# Patient Record
Sex: Male | Born: 1960 | Race: Black or African American | Hispanic: No | Smoking: Never smoker
Health system: Southern US, Community
[De-identification: ages and names within clinical notes are randomized; demographics above are authoritative.]

## PROBLEM LIST (undated history)

## (undated) DIAGNOSIS — I639 Cerebral infarction, unspecified: Secondary | ICD-10-CM

## (undated) DIAGNOSIS — I10 Essential (primary) hypertension: Secondary | ICD-10-CM

## (undated) HISTORY — PX: CIRCUMCISION: SUR203

---

## 2008-02-03 ENCOUNTER — Ambulatory Visit: Payer: Self-pay | Admitting: Cardiology

## 2016-11-04 ENCOUNTER — Other Ambulatory Visit (HOSPITAL_COMMUNITY)
Admission: RE | Admit: 2016-11-04 | Discharge: 2016-11-04 | Disposition: A | Discharge status: Home / Self Care | Payer: Managed Care, Other (non HMO) | Source: Other Acute Inpatient Hospital | Attending: Urology | Admitting: Urology

## 2016-11-04 ENCOUNTER — Ambulatory Visit (INDEPENDENT_AMBULATORY_CARE_PROVIDER_SITE_OTHER): Payer: Managed Care, Other (non HMO) | Admitting: Urology

## 2016-11-04 DIAGNOSIS — N401 Enlarged prostate with lower urinary tract symptoms: Secondary | ICD-10-CM | POA: Diagnosis not present

## 2016-11-04 DIAGNOSIS — N5201 Erectile dysfunction due to arterial insufficiency: Secondary | ICD-10-CM

## 2016-11-04 DIAGNOSIS — R972 Elevated prostate specific antigen [PSA]: Secondary | ICD-10-CM | POA: Diagnosis not present

## 2016-11-11 ENCOUNTER — Ambulatory Visit: Payer: Managed Care, Other (non HMO) | Admitting: Urology

## 2016-11-11 ENCOUNTER — Other Ambulatory Visit (HOSPITAL_COMMUNITY)
Admission: RE | Admit: 2016-11-11 | Discharge: 2016-11-11 | Disposition: A | Discharge status: Home / Self Care | Payer: Managed Care, Other (non HMO) | Source: Other Acute Inpatient Hospital | Attending: Urology | Admitting: Urology

## 2016-11-11 DIAGNOSIS — R972 Elevated prostate specific antigen [PSA]: Secondary | ICD-10-CM | POA: Insufficient documentation

## 2016-11-14 LAB — MISC LABCORP TEST (SEND OUT): LABCORP TEST CODE: 489160

## 2017-08-25 ENCOUNTER — Ambulatory Visit (INDEPENDENT_AMBULATORY_CARE_PROVIDER_SITE_OTHER): Payer: Managed Care, Other (non HMO) | Admitting: Urology

## 2017-08-25 DIAGNOSIS — R972 Elevated prostate specific antigen [PSA]: Secondary | ICD-10-CM | POA: Diagnosis not present

## 2018-03-19 ENCOUNTER — Other Ambulatory Visit: Payer: Self-pay | Admitting: Neurosurgery

## 2018-03-24 ENCOUNTER — Other Ambulatory Visit: Payer: Self-pay | Admitting: Neurosurgery

## 2018-03-31 NOTE — Pre-Procedure Instructions (Signed)
Tom Palmer  03/31/2018      [id number] - [place], [place] - [address] AT [address] of [address] [place] [address] Phone: [phone] Fax: [phone]    Your procedure is scheduled on Thursday, Sept. 19th   Report to [facility]             (posted surgery time 2:04p - 4:22p)   Call this number if you have problems the morning of surgery:  [phone]   Remember:   Do not eat any foods or drink any liquids after midnight, Wednesday.              4-5 days prior to surgery, STOP TAKING ANY Vitamins, Herbal Supplements, Anti-inflammatories.    Take these medicines the morning of surgery with A SIP OF WATER : Amlodipine, Cyclobenzaprine.    Do not wear jewelry - no rings or watches.  Do not wear lotions, colognes or deodorant.             Men may shave face and neck.  Do not bring valuables to the hospital.  [facility] is not responsible for any belongings or valuables.  Contacts, dentures or bridgework may not be worn into surgery.  Leave your suitcase in the car.  After surgery it may be brought to your room.  For patients admitted to the hospital, discharge time will be determined by your treatment team.  Please read over the following fact sheets that you were given. MRSA Information and Surgical Site Infection Prevention      [facility]- Preparing For Surgery  Before surgery, you can play an important role. Because skin is not sterile, your skin needs to be as free of germs as possible. You can reduce the number of germs on your skin by washing with CHG (chlorahexidine gluconate) Soap before surgery.  CHG is an antiseptic cleaner which kills germs and bonds with the skin to continue killing germs even after washing.    Oral Hygiene is also important to reduce your risk of infection.    Remember - BRUSH YOUR TEETH THE MORNING OF SURGERY WITH YOUR REGULAR TOOTHPASTE  Please do  not use if you have an allergy to CHG or antibacterial soaps. If your skin becomes reddened/irritated stop using the CHG.  Do not shave (including legs and underarms) for at least 48 hours prior to first CHG shower. It is OK to shave your face.  Please follow these instructions carefully.   1. Shower the NIGHT BEFORE SURGERY and the MORNING OF SURGERY with CHG.   2. If you chose to wash your hair, wash your hair first as usual with your normal shampoo.  3. After you shampoo, rinse your hair and body thoroughly to remove the shampoo.  4. Use CHG as you would any other liquid soap. You can apply CHG directly to the skin and wash gently with a scrungie or a clean washcloth.   5. Apply the CHG Soap to your body ONLY FROM THE NECK DOWN.  Do not use on open wounds or open sores. Avoid contact with your eyes, ears, mouth and genitals (private parts). Wash Face and genitals (private parts)  with your normal soap.  6. Wash thoroughly, paying special attention to the area where your surgery will be performed.  7. Thoroughly rinse your body with warm water from the neck down.  8. DO NOT shower/wash with your normal soap after  using and rinsing off the CHG Soap.  9. Pat yourself dry with a CLEAN TOWEL.  10. Wear CLEAN PAJAMAS to bed the night before surgery, wear comfortable clothes the morning of surgery  11. Place CLEAN SHEETS on your bed the night of your first shower and DO NOT SLEEP WITH PETS.  Day of Surgery:  Do not apply any deodorants/lotions.  Please wear clean clothes to the hospital/surgery center.    Remember to brush your teeth WITH YOUR REGULAR TOOTHPASTE.

## 2018-04-01 ENCOUNTER — Encounter (HOSPITAL_COMMUNITY)
Admission: RE | Admit: 2018-04-01 | Discharge: 2018-04-01 | Disposition: A | Discharge status: Home / Self Care | Payer: Managed Care, Other (non HMO) | Source: Ambulatory Visit | Attending: Neurosurgery | Admitting: Neurosurgery

## 2018-04-01 ENCOUNTER — Other Ambulatory Visit: Payer: Self-pay

## 2018-04-01 ENCOUNTER — Encounter (HOSPITAL_COMMUNITY): Payer: Self-pay

## 2018-04-01 DIAGNOSIS — Z79899 Other long term (current) drug therapy: Secondary | ICD-10-CM | POA: Insufficient documentation

## 2018-04-01 DIAGNOSIS — Z8673 Personal history of transient ischemic attack (TIA), and cerebral infarction without residual deficits: Secondary | ICD-10-CM | POA: Diagnosis not present

## 2018-04-01 DIAGNOSIS — R001 Bradycardia, unspecified: Secondary | ICD-10-CM | POA: Diagnosis not present

## 2018-04-01 DIAGNOSIS — Z01818 Encounter for other preprocedural examination: Secondary | ICD-10-CM | POA: Diagnosis present

## 2018-04-01 DIAGNOSIS — M5126 Other intervertebral disc displacement, lumbar region: Secondary | ICD-10-CM | POA: Diagnosis not present

## 2018-04-01 DIAGNOSIS — I10 Essential (primary) hypertension: Secondary | ICD-10-CM | POA: Insufficient documentation

## 2018-04-01 DIAGNOSIS — Z7982 Long term (current) use of aspirin: Secondary | ICD-10-CM | POA: Diagnosis not present

## 2018-04-01 HISTORY — DX: Cerebral infarction, unspecified: I63.9

## 2018-04-01 HISTORY — DX: Essential (primary) hypertension: I10

## 2018-04-01 LAB — BASIC METABOLIC PANEL
ANION GAP: 8 (ref 5–15)
BUN: 7 mg/dL (ref 6–20)
CALCIUM: 9.3 mg/dL (ref 8.9–10.3)
CO2: 25 mmol/L (ref 22–32)
Chloride: 108 mmol/L (ref 98–111)
Creatinine, Ser: 1.07 mg/dL (ref 0.61–1.24)
GLUCOSE: 103 mg/dL — AB (ref 70–99)
Potassium: 3.5 mmol/L (ref 3.5–5.1)
Sodium: 141 mmol/L (ref 135–145)

## 2018-04-01 LAB — SURGICAL PCR SCREEN
MRSA, PCR: NEGATIVE
Staphylococcus aureus: POSITIVE — AB

## 2018-04-01 LAB — CBC
HCT: 40.7 % (ref 39.0–52.0)
Hemoglobin: 13.5 g/dL (ref 13.0–17.0)
MCH: 29.4 pg (ref 26.0–34.0)
MCHC: 33.2 g/dL (ref 30.0–36.0)
MCV: 88.7 fL (ref 78.0–100.0)
PLATELETS: 268 10*3/uL (ref 150–400)
RBC: 4.59 MIL/uL (ref 4.22–5.81)
RDW: 13 % (ref 11.5–15.5)
WBC: 5.5 10*3/uL (ref 4.0–10.5)

## 2018-04-01 NOTE — Progress Notes (Signed)
   04/01/18 1405  OBSTRUCTIVE SLEEP APNEA  Have you ever been diagnosed with sleep apnea through a sleep study? No  Do you snore loudly (loud enough to be heard through closed doors)?  0  Do you often feel tired, fatigued, or sleepy during the daytime (such as falling asleep during driving or talking to someone)? 0  Has anyone observed you stop breathing during your sleep? 1  Do you have, or are you being treated for high blood pressure? 1  BMI more than 35 kg/m2? 0  Age > 50 (1-yes) 1  Neck circumference greater than:Male 16 inches or larger, Male 17inches or larger? 1  Male Gender (Yes=1) 1  Obstructive Sleep Apnea Score 5  Score 5 or greater  Results sent to PCP

## 2018-04-01 NOTE — Progress Notes (Signed)
PCP is Dr. Polly CobiaHasanji, LOV 12/2017 Denies any murmur, cp, sob.   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  Affected his right side, with little residual effect.  [facility].  (have rquested the echo, ekg, ultrasound from there) Hasn't seen a cardio in yrs. He is stopping his aspirin as of 9/11.

## 2018-04-01 NOTE — Progress Notes (Signed)
Surgical PCR +staph aureus. [phone]. Patient notified to pick up ointment and start as soon as possible.

## 2018-04-02 ENCOUNTER — Encounter (HOSPITAL_COMMUNITY): Payer: Self-pay

## 2018-04-02 NOTE — Progress Notes (Addendum)
Anesthesia Chart Review:   Case:  [id number] Date/Time:  04/08/18 1408   Procedure:  LAMINECTOMY  AND FORAMINOTOMY BILATERAL LUMBAR 2- LUMBAR 3, LUMBAR 3- LUMBAR 4 (Bilateral ) - LAMINECTOMY  AND FORAMINOTOMY BILATERAL LUMBAR 2- LUMBAR 3, LUMBAR 3- LUMBAR 4   Anesthesia type:  General   Pre-op diagnosis:  DISC DISPLACEMENT, LUMBAR   Location:  [facility] ROOM [facility] / [facility] OR   Surgeon:  Tressie StalkerJenkins, Jeffrey, MD      DISCUSSION: - Pt is a 57 year old male with hx HTN  VS: BP (!) 143/73   Pulse 90   Temp 36.8 C   Resp 20   Ht 6\' 3"  (1.905 m)   Wt 109.4 kg   SpO2 99%   BMI 30.14 kg/m   PROVIDERS: - PCP is Toma DeitersHasanaj, Xaje A, MD   LABS: Labs reviewed: Acceptable for surgery. (all labs ordered are listed, but only abnormal results are displayed)  Labs Reviewed  SURGICAL PCR SCREEN - Abnormal; Notable for the following components:      Result Value   Staphylococcus aureus POSITIVE (*)    All other components within normal limits  BASIC METABOLIC PANEL - Abnormal; Notable for the following components:   Glucose, Bld 103 (*)    All other components within normal limits  CBC    EKG 04/01/18: Sinus bradycardia (57 bpm)   CV:  Stress test 12/13/15 [facility] Attempting to get final report.    ADDENDUM 04/05/2018: Records received from Dr. Olena LeatherwoodHasanaj. Stress test results below:  Description of the test: The patient exercised for total of 1 minute and 20 seconds.  Reached 7 METS.  Target heart rate 139.  Maximal heart rate 158.  Blood pressure 203/102.  Resting EKG revealed sinus rhythm.  No signs of ischemia.  Stress EKG revealed occasional PVCs.  No acute ST segment elevation or depression.  Conclusion of the test: Cardiac stress test felt to be negative.  As such, the patient was reassured, advised to comply with present medication and call with any other problems.   Echo 12/13/15 [facility]):  1.  Study quality is good. 2.  LV chamber size normal. Mild concentric LVH. Global  LV wall motion and contractility are within normal limits.   Estimated EF 60-65%.  Normal LV diastolic filling is observed. 3.  LA mildly dilated. 4.  Aortic valve structure normal.  Mitral valve leaflets appear normal. 5.  RV systolic pressure calculated at 30 mmHg.  Past Medical History:  Diagnosis Date  . Stroke [facility](HCC)    tia back in 2008    Past Surgical History:  Procedure Laterality Date  . CIRCUMCISION      MEDICATIONS: . acetaminophen (TYLENOL) 500 MG tablet  . amLODipine (NORVASC) 10 MG tablet  . aspirin EC 81 MG tablet  . cyclobenzaprine (FLEXERIL) 5 MG tablet  . lisinopril-hydrochlorothiazide (PRINZIDE,ZESTORETIC) 20-12.5 MG tablet  . Omega-3 Fatty Acids (FISH OIL) 1000 MG CAPS  . tadalafil (CIALIS) 5 MG tablet   No current facility-administered medications for this encounter.     Rica Mastngela Kabbe, FNP-BC [facility]/Anesthesiology Phone: [phone] 04/02/2018 1:37 PM

## 2018-04-08 ENCOUNTER — Ambulatory Visit (HOSPITAL_COMMUNITY): Payer: Managed Care, Other (non HMO) | Admitting: Anesthesiology

## 2018-04-08 ENCOUNTER — Ambulatory Visit (HOSPITAL_COMMUNITY)
Admission: RE | Admit: 2018-04-08 | Discharge: 2018-04-09 | Disposition: A | Discharge status: Home / Self Care | Payer: Managed Care, Other (non HMO) | Source: Ambulatory Visit | Attending: Neurosurgery | Admitting: Neurosurgery

## 2018-04-08 ENCOUNTER — Ambulatory Visit (HOSPITAL_COMMUNITY): Payer: Managed Care, Other (non HMO)

## 2018-04-08 ENCOUNTER — Ambulatory Visit (HOSPITAL_COMMUNITY): Payer: Managed Care, Other (non HMO) | Admitting: Physician Assistant

## 2018-04-08 ENCOUNTER — Encounter (HOSPITAL_COMMUNITY)
Admission: RE | Disposition: A | Discharge status: Home / Self Care | Payer: Self-pay | Source: Ambulatory Visit | Attending: Neurosurgery

## 2018-04-08 ENCOUNTER — Encounter (HOSPITAL_COMMUNITY): Payer: Self-pay

## 2018-04-08 DIAGNOSIS — Z8673 Personal history of transient ischemic attack (TIA), and cerebral infarction without residual deficits: Secondary | ICD-10-CM | POA: Insufficient documentation

## 2018-04-08 DIAGNOSIS — Z7982 Long term (current) use of aspirin: Secondary | ICD-10-CM | POA: Insufficient documentation

## 2018-04-08 DIAGNOSIS — M48062 Spinal stenosis, lumbar region with neurogenic claudication: Secondary | ICD-10-CM | POA: Diagnosis present

## 2018-04-08 DIAGNOSIS — I10 Essential (primary) hypertension: Secondary | ICD-10-CM | POA: Insufficient documentation

## 2018-04-08 DIAGNOSIS — Z6829 Body mass index (BMI) 29.0-29.9, adult: Secondary | ICD-10-CM | POA: Insufficient documentation

## 2018-04-08 DIAGNOSIS — E669 Obesity, unspecified: Secondary | ICD-10-CM | POA: Insufficient documentation

## 2018-04-08 DIAGNOSIS — M5116 Intervertebral disc disorders with radiculopathy, lumbar region: Secondary | ICD-10-CM | POA: Diagnosis not present

## 2018-04-08 DIAGNOSIS — Z419 Encounter for procedure for purposes other than remedying health state, unspecified: Secondary | ICD-10-CM

## 2018-04-08 DIAGNOSIS — Z79899 Other long term (current) drug therapy: Secondary | ICD-10-CM | POA: Insufficient documentation

## 2018-04-08 HISTORY — PX: LUMBAR LAMINECTOMY/DECOMPRESSION MICRODISCECTOMY: SHX5026

## 2018-04-08 IMAGING — CR DG LUMBAR SPINE 1V
1 series · 1 of 1 positions shown · non-contrast
Comparison: [DATE] MRI

CLINICAL DATA: Localizing image 4 L2-3/L3-4 laminectomy and
foraminotomy for disc displacement.

EXAM:
LUMBAR SPINE - 1 VIEW

[lateral]
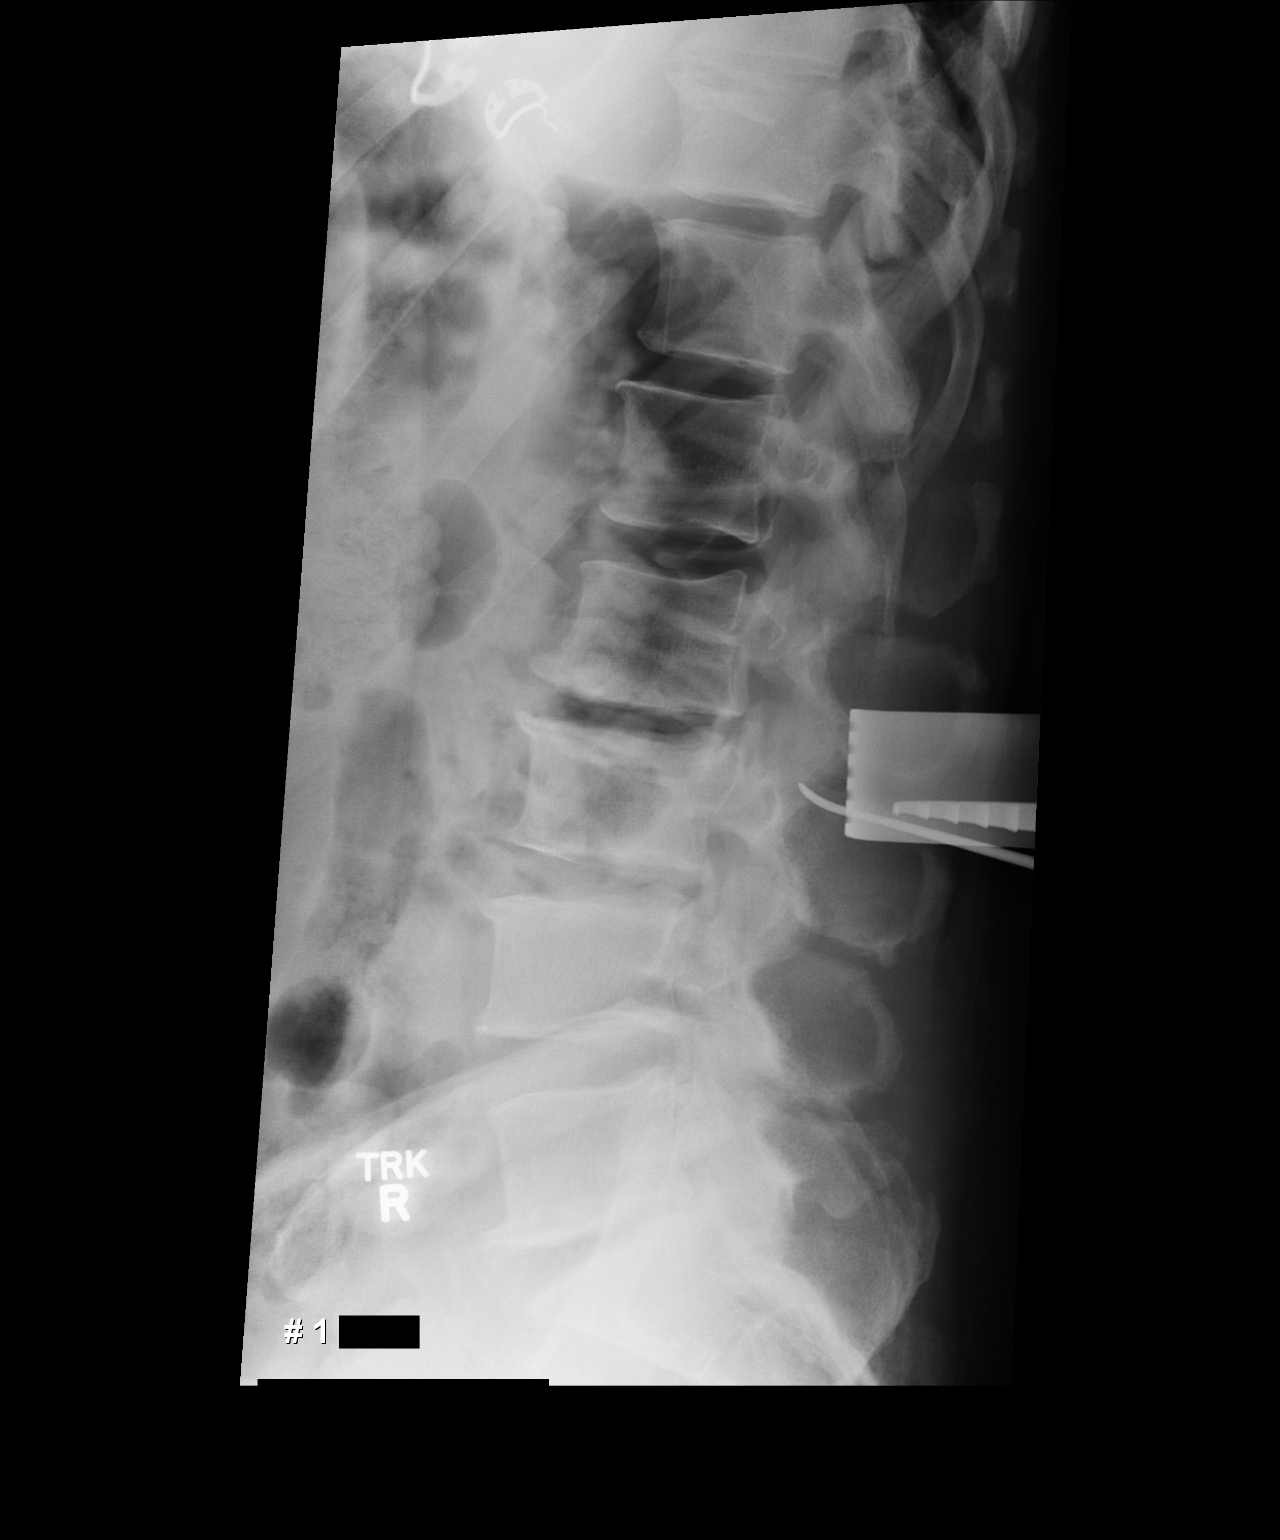

[1 of 1 positions shown; findings below may reference images not displayed]

FINDINGS: A portable cross-table view of the lumbar spine intraoperatively
demonstrates markers projecting over the posterior elements at the
L2-3 disc level. Degenerative disc disease with vacuum phenomenon
noted at L2-3. Facet arthrosis is identified at L4-5 and L5-S1 in
particular.
IMPRESSION: Localizing markers project over the posterior elements at the L2-3
disc level.

## 2018-04-08 SURGERY — LUMBAR LAMINECTOMY/DECOMPRESSION MICRODISCECTOMY 2 LEVELS
Anesthesia: General | Laterality: Bilateral

## 2018-04-08 MED ORDER — AMLODIPINE BESYLATE 5 MG PO TABS
10.0000 mg | ORAL_TABLET | Freq: Every day | ORAL | Status: DC
  Administered 2018-04-08: 10 mg via ORAL
  Filled 2018-04-08 (×2): qty 2

## 2018-04-08 MED ORDER — CEFAZOLIN SODIUM-DEXTROSE 2-4 GM/100ML-% IV SOLN
INTRAVENOUS | Status: AC
  Filled 2018-04-08: qty 100

## 2018-04-08 MED ORDER — SODIUM CHLORIDE 0.9 % IV SOLN
INTRAVENOUS | Status: DC | PRN
  Administered 2018-04-08: 16:00:00

## 2018-04-08 MED ORDER — PROPOFOL 10 MG/ML IV BOLUS
INTRAVENOUS | Status: DC | PRN
  Administered 2018-04-08: 200 mg via INTRAVENOUS
  Administered 2018-04-08: 40 mg via INTRAVENOUS

## 2018-04-08 MED ORDER — OXYCODONE HCL 5 MG/5ML PO SOLN: 5.0000 mg | Freq: Once | ORAL | Status: DC | PRN

## 2018-04-08 MED ORDER — ACETAMINOPHEN 325 MG PO TABS: 650.0000 mg | ORAL_TABLET | ORAL | Status: DC | PRN

## 2018-04-08 MED ORDER — PROPOFOL 10 MG/ML IV BOLUS
INTRAVENOUS | Status: AC
  Filled 2018-04-08: qty 20

## 2018-04-08 MED ORDER — ALBUMIN HUMAN 5 % IV SOLN
INTRAVENOUS | Status: DC | PRN
  Administered 2018-04-08: 18:00:00 via INTRAVENOUS

## 2018-04-08 MED ORDER — DOCUSATE SODIUM 100 MG PO CAPS
100.0000 mg | ORAL_CAPSULE | Freq: Two times a day (BID) | ORAL | Status: DC
  Administered 2018-04-08 – 2018-04-09 (×2): 100 mg via ORAL
  Filled 2018-04-08 (×2): qty 1

## 2018-04-08 MED ORDER — LIDOCAINE 2% (20 MG/ML) 5 ML SYRINGE
INTRAMUSCULAR | Status: DC | PRN
  Administered 2018-04-08: 100 mg via INTRAVENOUS

## 2018-04-08 MED ORDER — ROCURONIUM BROMIDE 10 MG/ML (PF) SYRINGE
PREFILLED_SYRINGE | INTRAVENOUS | Status: DC | PRN
  Administered 2018-04-08: 20 mg via INTRAVENOUS
  Administered 2018-04-08: 10 mg via INTRAVENOUS
  Administered 2018-04-08: 50 mg via INTRAVENOUS

## 2018-04-08 MED ORDER — ONDANSETRON HCL 4 MG/2ML IJ SOLN: 4.0000 mg | Freq: Four times a day (QID) | INTRAMUSCULAR | Status: DC | PRN

## 2018-04-08 MED ORDER — CEFAZOLIN SODIUM-DEXTROSE 2-4 GM/100ML-% IV SOLN
2.0000 g | Freq: Three times a day (TID) | INTRAVENOUS | Status: AC
  Administered 2018-04-08 – 2018-04-09 (×2): 2 g via INTRAVENOUS
  Filled 2018-04-08 (×2): qty 100

## 2018-04-08 MED ORDER — ACETAMINOPHEN 650 MG RE SUPP: 650.0000 mg | RECTAL | Status: DC | PRN

## 2018-04-08 MED ORDER — SODIUM CHLORIDE 0.9 % IV SOLN: 250.0000 mL | INTRAVENOUS | Status: DC

## 2018-04-08 MED ORDER — ONDANSETRON HCL 4 MG/2ML IJ SOLN
INTRAMUSCULAR | Status: DC | PRN
  Administered 2018-04-08: 4 mg via INTRAVENOUS

## 2018-04-08 MED ORDER — BUPIVACAINE-EPINEPHRINE (PF) 0.5% -1:200000 IJ SOLN
INTRAMUSCULAR | Status: DC | PRN
  Administered 2018-04-08: 10 mL
  Administered 2018-04-08: 20 mL

## 2018-04-08 MED ORDER — CHLORHEXIDINE GLUCONATE CLOTH 2 % EX PADS: 6.0000 | MEDICATED_PAD | Freq: Once | CUTANEOUS | Status: DC

## 2018-04-08 MED ORDER — MIDAZOLAM HCL 5 MG/5ML IJ SOLN
INTRAMUSCULAR | Status: DC | PRN
  Administered 2018-04-08: 2 mg via INTRAVENOUS

## 2018-04-08 MED ORDER — DEXAMETHASONE SODIUM PHOSPHATE 10 MG/ML IJ SOLN
INTRAMUSCULAR | Status: DC | PRN
  Administered 2018-04-08: 10 mg via INTRAVENOUS

## 2018-04-08 MED ORDER — GLYCOPYRROLATE PF 0.2 MG/ML IJ SOSY
PREFILLED_SYRINGE | INTRAMUSCULAR | Status: AC
  Filled 2018-04-08: qty 2

## 2018-04-08 MED ORDER — GLYCOPYRROLATE PF 0.2 MG/ML IJ SOSY
PREFILLED_SYRINGE | INTRAMUSCULAR | Status: DC | PRN
  Administered 2018-04-08: .2 mg via INTRAVENOUS

## 2018-04-08 MED ORDER — PROMETHAZINE HCL 25 MG/ML IJ SOLN: 6.2500 mg | INTRAMUSCULAR | Status: DC | PRN

## 2018-04-08 MED ORDER — ARTIFICIAL TEARS OPHTHALMIC OINT
TOPICAL_OINTMENT | OPHTHALMIC | Status: AC
  Filled 2018-04-08: qty 7

## 2018-04-08 MED ORDER — PHENOL 1.4 % MT LIQD: 1.0000 | OROMUCOSAL | Status: DC | PRN

## 2018-04-08 MED ORDER — ONDANSETRON HCL 4 MG/2ML IJ SOLN
INTRAMUSCULAR | Status: AC
  Filled 2018-04-08: qty 2

## 2018-04-08 MED ORDER — HYDROCHLOROTHIAZIDE 12.5 MG PO CAPS
12.5000 mg | ORAL_CAPSULE | Freq: Every day | ORAL | Status: DC
  Administered 2018-04-09: 12.5 mg via ORAL
  Filled 2018-04-08: qty 1

## 2018-04-08 MED ORDER — OXYCODONE HCL 5 MG PO TABS
10.0000 mg | ORAL_TABLET | ORAL | Status: DC | PRN
  Administered 2018-04-08 – 2018-04-09 (×5): 10 mg via ORAL
  Filled 2018-04-08 (×5): qty 2

## 2018-04-08 MED ORDER — 0.9 % SODIUM CHLORIDE (POUR BTL) OPTIME
TOPICAL | Status: DC | PRN
  Administered 2018-04-08: 1000 mL

## 2018-04-08 MED ORDER — OXYCODONE HCL 5 MG PO TABS: 5.0000 mg | ORAL_TABLET | Freq: Once | ORAL | Status: DC | PRN

## 2018-04-08 MED ORDER — CEFAZOLIN SODIUM-DEXTROSE 2-4 GM/100ML-% IV SOLN
2.0000 g | INTRAVENOUS | Status: AC
  Administered 2018-04-08: 2 g via INTRAVENOUS

## 2018-04-08 MED ORDER — BACITRACIN ZINC 500 UNIT/GM EX OINT
TOPICAL_OINTMENT | CUTANEOUS | Status: DC | PRN
  Administered 2018-04-08: 1 via TOPICAL

## 2018-04-08 MED ORDER — BISACODYL 10 MG RE SUPP: 10.0000 mg | Freq: Every day | RECTAL | Status: DC | PRN

## 2018-04-08 MED ORDER — BUPIVACAINE-EPINEPHRINE 0.5% -1:200000 IJ SOLN
INTRAMUSCULAR | Status: AC
  Filled 2018-04-08: qty 1

## 2018-04-08 MED ORDER — BUPIVACAINE LIPOSOME 1.3 % IJ SUSP
20.0000 mL | INTRAMUSCULAR | Status: DC
  Filled 2018-04-08: qty 20

## 2018-04-08 MED ORDER — ACETAMINOPHEN 500 MG PO TABS
1000.0000 mg | ORAL_TABLET | Freq: Four times a day (QID) | ORAL | Status: DC
  Administered 2018-04-08 – 2018-04-09 (×2): 1000 mg via ORAL
  Filled 2018-04-08 (×2): qty 2

## 2018-04-08 MED ORDER — LACTATED RINGERS IV SOLN
INTRAVENOUS | Status: DC
  Administered 2018-04-08 (×3): via INTRAVENOUS

## 2018-04-08 MED ORDER — MIDAZOLAM HCL 2 MG/2ML IJ SOLN
INTRAMUSCULAR | Status: AC
  Filled 2018-04-08: qty 2

## 2018-04-08 MED ORDER — MENTHOL 3 MG MT LOZG: 1.0000 | LOZENGE | OROMUCOSAL | Status: DC | PRN

## 2018-04-08 MED ORDER — ONDANSETRON HCL 4 MG PO TABS: 4.0000 mg | ORAL_TABLET | Freq: Four times a day (QID) | ORAL | Status: DC | PRN

## 2018-04-08 MED ORDER — ROCURONIUM BROMIDE 50 MG/5ML IV SOSY
PREFILLED_SYRINGE | INTRAVENOUS | Status: AC
  Filled 2018-04-08: qty 15

## 2018-04-08 MED ORDER — DEXAMETHASONE SODIUM PHOSPHATE 10 MG/ML IJ SOLN
INTRAMUSCULAR | Status: AC
  Filled 2018-04-08: qty 1

## 2018-04-08 MED ORDER — LISINOPRIL 20 MG PO TABS
20.0000 mg | ORAL_TABLET | Freq: Every day | ORAL | Status: DC
  Administered 2018-04-09: 20 mg via ORAL
  Filled 2018-04-08: qty 1

## 2018-04-08 MED ORDER — FENTANYL CITRATE (PF) 100 MCG/2ML IJ SOLN
INTRAMUSCULAR | Status: AC
  Filled 2018-04-08: qty 2

## 2018-04-08 MED ORDER — FENTANYL CITRATE (PF) 250 MCG/5ML IJ SOLN
INTRAMUSCULAR | Status: DC | PRN
  Administered 2018-04-08 (×2): 50 ug via INTRAVENOUS
  Administered 2018-04-08: 100 ug via INTRAVENOUS
  Administered 2018-04-08: 50 ug via INTRAVENOUS

## 2018-04-08 MED ORDER — BACITRACIN ZINC 500 UNIT/GM EX OINT
TOPICAL_OINTMENT | CUTANEOUS | Status: AC
  Filled 2018-04-08: qty 28.35

## 2018-04-08 MED ORDER — CYCLOBENZAPRINE HCL 10 MG PO TABS
10.0000 mg | ORAL_TABLET | Freq: Three times a day (TID) | ORAL | Status: DC | PRN
  Administered 2018-04-08 – 2018-04-09 (×2): 10 mg via ORAL
  Filled 2018-04-08 (×2): qty 1

## 2018-04-08 MED ORDER — MORPHINE SULFATE (PF) 4 MG/ML IV SOLN
4.0000 mg | INTRAVENOUS | Status: DC | PRN
  Filled 2018-04-08: qty 1

## 2018-04-08 MED ORDER — ROCURONIUM BROMIDE 50 MG/5ML IV SOSY
PREFILLED_SYRINGE | INTRAVENOUS | Status: AC
  Filled 2018-04-08: qty 10

## 2018-04-08 MED ORDER — SODIUM CHLORIDE 0.9% FLUSH: 3.0000 mL | Freq: Two times a day (BID) | INTRAVENOUS | Status: DC

## 2018-04-08 MED ORDER — FENTANYL CITRATE (PF) 250 MCG/5ML IJ SOLN
INTRAMUSCULAR | Status: AC
  Filled 2018-04-08: qty 5

## 2018-04-08 MED ORDER — SODIUM CHLORIDE 0.9% FLUSH: 3.0000 mL | INTRAVENOUS | Status: DC | PRN

## 2018-04-08 MED ORDER — HEMOSTATIC AGENTS (NO CHARGE) OPTIME
TOPICAL | Status: DC | PRN
  Administered 2018-04-08: 1 via TOPICAL

## 2018-04-08 MED ORDER — OXYCODONE HCL 5 MG PO TABS: 5.0000 mg | ORAL_TABLET | ORAL | Status: DC | PRN

## 2018-04-08 MED ORDER — FENTANYL CITRATE (PF) 100 MCG/2ML IJ SOLN
25.0000 ug | INTRAMUSCULAR | Status: DC | PRN
  Administered 2018-04-08: 25 ug via INTRAVENOUS

## 2018-04-08 MED ORDER — LISINOPRIL-HYDROCHLOROTHIAZIDE 20-12.5 MG PO TABS: 1.0000 | ORAL_TABLET | Freq: Every day | ORAL | Status: DC

## 2018-04-08 SURGICAL SUPPLY — 56 items
BAG DECANTER FOR FLEXI CONT (MISCELLANEOUS) ×3 IMPLANT
BENZOIN TINCTURE PRP APPL 2/3 (GAUZE/BANDAGES/DRESSINGS) ×3 IMPLANT
BLADE CLIPPER SURG (BLADE) IMPLANT
BUR MATCHSTICK NEURO 3.0 LAGG (BURR) ×3 IMPLANT
BUR PRECISION FLUTE 6.0 (BURR) ×3 IMPLANT
CANISTER SUCT 3000ML PPV (MISCELLANEOUS) ×3 IMPLANT
CARTRIDGE OIL MAESTRO DRILL (MISCELLANEOUS) ×1 IMPLANT
CLOSURE WOUND 1/2 X4 (GAUZE/BANDAGES/DRESSINGS) ×1
DIFFUSER DRILL AIR PNEUMATIC (MISCELLANEOUS) ×3 IMPLANT
DRAPE LAPAROTOMY 100X72X124 (DRAPES) ×3 IMPLANT
DRAPE MICROSCOPE LEICA (MISCELLANEOUS) IMPLANT
DRAPE POUCH INSTRU U-SHP 10X18 (DRAPES) ×3 IMPLANT
DRAPE SURG 17X23 STRL (DRAPES) ×12 IMPLANT
DRSG OPSITE POSTOP 4X6 (GAUZE/BANDAGES/DRESSINGS) ×3 IMPLANT
ELECT BLADE 4.0 EZ CLEAN MEGAD (MISCELLANEOUS) ×3
ELECT REM PT RETURN 9FT ADLT (ELECTROSURGICAL) ×3
ELECTRODE BLDE 4.0 EZ CLN MEGD (MISCELLANEOUS) ×1 IMPLANT
ELECTRODE REM PT RTRN 9FT ADLT (ELECTROSURGICAL) ×1 IMPLANT
GAUZE 4X4 16PLY RFD (DISPOSABLE) IMPLANT
GAUZE SPONGE 4X4 12PLY STRL (GAUZE/BANDAGES/DRESSINGS) ×3 IMPLANT
GLOVE BIO SURGEON STRL SZ8 (GLOVE) ×3 IMPLANT
GLOVE BIO SURGEON STRL SZ8.5 (GLOVE) ×3 IMPLANT
GLOVE BIOGEL PI IND STRL 7.5 (GLOVE) ×1 IMPLANT
GLOVE BIOGEL PI IND STRL 8 (GLOVE) ×2 IMPLANT
GLOVE BIOGEL PI INDICATOR 7.5 (GLOVE) ×2
GLOVE BIOGEL PI INDICATOR 8 (GLOVE) ×4
GLOVE ECLIPSE 7.0 STRL STRAW (GLOVE) ×3 IMPLANT
GLOVE ECLIPSE 7.5 STRL STRAW (GLOVE) ×9 IMPLANT
GLOVE EXAM NITRILE LRG STRL (GLOVE) IMPLANT
GLOVE EXAM NITRILE XL STR (GLOVE) IMPLANT
GLOVE EXAM NITRILE XS STR PU (GLOVE) IMPLANT
GOWN STRL REUS W/ TWL LRG LVL3 (GOWN DISPOSABLE) ×1 IMPLANT
GOWN STRL REUS W/ TWL XL LVL3 (GOWN DISPOSABLE) ×1 IMPLANT
GOWN STRL REUS W/TWL 2XL LVL3 (GOWN DISPOSABLE) ×6 IMPLANT
GOWN STRL REUS W/TWL LRG LVL3 (GOWN DISPOSABLE) ×2
GOWN STRL REUS W/TWL XL LVL3 (GOWN DISPOSABLE) ×2
KIT BASIN OR (CUSTOM PROCEDURE TRAY) ×3 IMPLANT
KIT TURNOVER KIT B (KITS) ×3 IMPLANT
NEEDLE HYPO 21X1.5 SAFETY (NEEDLE) IMPLANT
NEEDLE HYPO 22GX1.5 SAFETY (NEEDLE) ×3 IMPLANT
NS IRRIG 1000ML POUR BTL (IV SOLUTION) ×3 IMPLANT
OIL CARTRIDGE MAESTRO DRILL (MISCELLANEOUS) ×3
PACK LAMINECTOMY NEURO (CUSTOM PROCEDURE TRAY) ×3 IMPLANT
PAD ARMBOARD 7.5X6 YLW CONV (MISCELLANEOUS) ×9 IMPLANT
PATTIES SURGICAL .5 X1 (DISPOSABLE) IMPLANT
RUBBERBAND STERILE (MISCELLANEOUS) IMPLANT
SPOGE SURGIFLO 8M (HEMOSTASIS) ×2
SPONGE SURGIFLO 8M (HEMOSTASIS) ×1 IMPLANT
SPONGE SURGIFOAM ABS GEL SZ50 (HEMOSTASIS) ×3 IMPLANT
STRIP CLOSURE SKIN 1/2X4 (GAUZE/BANDAGES/DRESSINGS) ×2 IMPLANT
SUT VIC AB 1 CT1 18XBRD ANBCTR (SUTURE) ×2 IMPLANT
SUT VIC AB 1 CT1 8-18 (SUTURE) ×4
SUT VIC AB 2-0 CP2 18 (SUTURE) ×6 IMPLANT
TOWEL GREEN STERILE (TOWEL DISPOSABLE) ×3 IMPLANT
TOWEL GREEN STERILE FF (TOWEL DISPOSABLE) ×3 IMPLANT
WATER STERILE IRR 1000ML POUR (IV SOLUTION) ×3 IMPLANT

## 2018-04-08 NOTE — Anesthesia Procedure Notes (Signed)
Procedure Name: Intubation Date/Time: 04/08/2018 3:14 PM Performed by: Elliot DallyHuggins, Kaleab Frasier, CRNA Pre-anesthesia Checklist: Patient identified, Emergency Drugs available, Suction available and Patient being monitored Patient Re-evaluated:Patient Re-evaluated prior to induction Oxygen Delivery Method: Circle System Utilized Preoxygenation: Pre-oxygenation with 100% oxygen Induction Type: IV induction Ventilation: Mask ventilation without difficulty Laryngoscope Size: Miller and 3 Grade View: Grade I Tube type: Oral Tube size: 7.5 mm Number of attempts: 1 Airway Equipment and Method: Stylet and Oral airway Placement Confirmation: ETT inserted through vocal cords under direct vision,  positive ETCO2 and breath sounds checked- equal and bilateral Secured at: 24 cm Tube secured with: Tape Dental Injury: Teeth and Oropharynx as per pre-operative assessment

## 2018-04-08 NOTE — Transfer of Care (Signed)
Immediate Anesthesia Transfer of Care Note  Patient: Tom Palmer  Procedure(s) Performed: LAMINECTOMY  AND FORAMINOTOMY BILATERAL LUMBAR TWO-THREE, LUMBAR THREE-FOUR (Bilateral )  Patient Location: PACU  Anesthesia Type:General  Level of Consciousness: drowsy  Airway & Oxygen Therapy: Patient Spontanous Breathing and Patient connected to nasal cannula oxygen  Post-op Assessment: Report given to RN and Post -op Vital signs reviewed and stable  Post vital signs: Reviewed and stable  Last Vitals:  Vitals Value Taken Time  BP 121/82 04/08/2018  5:55 PM  Temp    Pulse 94 04/08/2018  5:59 PM  Resp 17 04/08/2018  5:59 PM  SpO2 99 % 04/08/2018  5:59 PM  Vitals shown include unvalidated device data.  Last Pain:  Vitals:   04/08/18 1204  TempSrc:   PainSc: 3       Patients Stated Pain Goal: 3 (04/08/18 1204)  Complications: No apparent anesthesia complications

## 2018-04-08 NOTE — Op Note (Signed)
Brief history: The patient is a 57 year old black male who has complained of back and leg pain and weakness consistent with a lumbar radiculopathy/neurogenic claudication.  He has failed medical management and was worked up with a lumbar MRI.  This demonstrated L2-3 and L3-4 stenosis with a left L3-4 herniated disc.  I discussed the various treatment options with the patient including surgery.  He has weighed the risks, benefits, and alternatives of surgery and decided proceed with bilateral L2-3 and 3 4 laminotomy/foraminotomies and a left L3-4 discectomy.  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 Delma OfficerJeff Deztiny Sarra  Asst.: Dr. Conchita ParisNundkumar  Anesthesia: Gen. endotracheal  Estimated blood loss: 100 cc  Drains: None  Complications: None  Description of procedure: The patient was brought to the operating room by the anesthesia team. General endotracheal anesthesia was induced. The patient was turned to the prone position on the Wilson frame. The patient's lumbosacral region was then prepared with Betadine scrub and Betadine solution. Sterile drapes were applied.  I then injected the area to be incised with Marcaine with epinephrine solution. I then used a scalpel to make a linear midline incision over the L2-3 and L3-4 intervertebral disc space. I then used electrocautery to perform a bilateral subperiosteal dissection exposing the spinous process and lamina of L2, L3 and L4. We obtained intraoperative radiograph to confirm our location. I then inserted the [facility] retractor for exposure.  We then brought the operative microscope into the field. Under its  magnification and illumination we completed the microdissection. I used a high-speed drill to perform anomalies bilaterally at L2-3 and L3-4. I then used a Kerrison punches to widen the laminotomies and removed the ligamentum flavum at L2-3 and L3-4 bilaterally. We then used microdissection to free up the thecal sac and the bilateral L3 and L4 nerve root from the epidural tissue. I then used a Kerrison punch to perform a foraminotomy at about the level L3 and L4 nerve root. We then using the nerve root retractor to gently retract the thecal sac and the L3 nerve root medially L2-3.  We inspected the intervertebral disc.  It was bulging a bit but not herniated.  We did not perform a discectomy at this level.  We then retracted the L4 nerve root medially at L3-4.  This exposed the intervertebral disc. We identified the ruptured disc and remove it with the pituitary forceps.  There were no large holes in the annulus nor impending herniations so we did not perform a intervertebral discectomy.  I then palpated along the ventral surface of the thecal sac and along exit route of the bilateral L3 and L4 nerve root and noted that the neural structures were well decompressed. This completed the decompression.  We then obtained hemostasis using bipolar electrocautery. We irrigated the wound out with bacitracin solution. We then removed the retractor. We then reapproximated the patient's thoracolumbar fascia with interrupted #1 Vicryl suture. We then reapproximated the patient's subcutaneous tissue with interrupted 2-0 Vicryl suture. We then reapproximated patient's skin with Steri-Strips and benzoin. The was then coated with bacitracin ointment. The drapes were removed. The patient was subsequently returned to the supine position where they were extubated by the anesthesia team. The patient was then transported to the postanesthesia care unit in stable condition.  All sponge instrument and needle counts were reportedly  correct at the end of this case.

## 2018-04-08 NOTE — H&P (Signed)
Subjective: The patient is a 57 year old black male who has complained of back pain with a foot drop.  He has failed medical management.  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  I discussed the various treatment with the patient.  He has decided to proceed with surgery.  Past Medical History:  Diagnosis Date  . Hypertension   . Stroke [facility](HCC)    tia back in 2008    Past Surgical History:  Procedure Laterality Date  . CIRCUMCISION      No Known Allergies  Social History   Tobacco Use  . Smoking status: Never Smoker  . Smokeless tobacco: Never Used  Substance Use Topics  . Alcohol use: Yes    Alcohol/week: 4.0 standard drinks    Types: 3 Cans of beer, 1 Shots of liquor per week    History reviewed. No pertinent family history. Prior to Admission medications   Medication Sig Start Date End Date Taking? Authorizing Provider  acetaminophen (TYLENOL) 500 MG tablet Take 1,000 mg by mouth every 6 (six) hours as needed for moderate pain.   Yes [provider]  amLODipine (NORVASC) 10 MG tablet Take 10 mg by mouth daily. 02/26/18  Yes [provider]  aspirin EC 81 MG tablet Take 81 mg by mouth daily.   Yes [provider]  cyclobenzaprine (FLEXERIL) 5 MG tablet Take 5 mg by mouth 3 (three) times daily as needed for muscle spasms.  03/01/18  Yes [provider]  lisinopril-hydrochlorothiazide (PRINZIDE,ZESTORETIC) 20-12.5 MG tablet Take 1 tablet by mouth daily. 12/30/17  Yes [provider]  Omega-3 Fatty Acids (FISH OIL) 1000 MG CAPS Take 1,000 mg by mouth 2 (two) times daily.   Yes [provider]  tadalafil (CIALIS) 5 MG tablet Take 5 mg by mouth daily as needed (enlarged prostate).   Yes [provider]     Review of Systems  Positive ROS: As above  All other systems have been reviewed and were otherwise negative with the exception of those  mentioned in the HPI and as above.  Objective: Vital signs in last 24 hours: Temp:  [97.9 F (36.6 C)] 97.9 F (36.6 C) (09/19 1153) Pulse Rate:  [66] 66 (09/19 1153) Resp:  [18] 18 (09/19 1153) BP: (152)/(78) 152/78 (09/19 1153) SpO2:  [100 %] 100 % (09/19 1153) Weight:  [107 kg] 107 kg (09/19 1153) Estimated body mass index is 29.5 kg/m as calculated from the following:   Height as of this encounter: 6\' 3"  (1.905 m).   Weight as of this encounter: 107 kg.   General Appearance: Alert Head: Normocephalic, without obvious abnormality, atraumatic Eyes: PERRL, conjunctiva/corneas clear, EOM's intact,    Ears: Normal  Throat: Normal  Neck: Supple, Back: unremarkable Lungs: Clear to auscultation bilaterally, respirations unlabored Heart: Regular rate and rhythm, no murmur, rub or gallop Abdomen: Soft, non-tender Extremities: Extremities normal, atraumatic, no cyanosis or edema Skin: unremarkable  NEUROLOGIC:   Mental status: alert and oriented,Motor Exam -he has bilateral EHL/dorsiflexor weakness. Sensory Exam - grossly normal Reflexes:  Coordination - grossly normal Gait - grossly normal Balance - grossly normal Cranial Nerves: I: smell Not tested  II: visual acuity  OS: Normal  OD: Normal   II: visual fields Full to confrontation  II: pupils Equal, round, reactive to light  III,VII: ptosis None  III,IV,VI: extraocular muscles  Full ROM  V: mastication Normal  V: facial light  touch sensation  Normal  V,VII: corneal reflex  Present  VII: facial muscle function - upper  Normal  VII: facial muscle function - lower Normal  VIII: hearing Not tested  IX: soft palate elevation  Normal  IX,X: gag reflex Present  XI: trapezius strength  5/5  XI: sternocleidomastoid strength 5/5  XI: neck flexion strength  5/5  XII: tongue strength  Normal    Data Review Lab Results  Component Value Date   WBC 5.5 04/01/2018   HGB 13.5 04/01/2018   HCT 40.7 04/01/2018   MCV 88.7  04/01/2018   PLT 268 04/01/2018   Lab Results  Component Value Date   NA 141 04/01/2018   K 3.5 04/01/2018   CL 108 04/01/2018   CO2 25 04/01/2018   BUN 7 04/01/2018   CREATININE 1.07 04/01/2018   GLUCOSE 103 (H) 04/01/2018   No results found for: INR, PROTIME  Assessment/Plan: 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  We have discussed the various treatment options including surgery.  I have described the surgical treatment option of an L2-3 and L3-4 bilateral laminotomy, foraminotomy and left L3-4 discectomy.  I have shown him surgical models.  I have given him a surgical pamphlet.  We have discussed the risks, benefits, alternatives, expected postoperative course, and likelihood of achieving our goals with surgery.  I have answered all the patient's questions.  He has decided to proceed with surgery.   Cristi Loron 04/08/2018 2:45 PM

## 2018-04-08 NOTE — Anesthesia Preprocedure Evaluation (Addendum)
Anesthesia Evaluation  Patient identified by MRN, date of birth, ID band Patient awake    Reviewed: Allergy & Precautions, NPO status , Patient's Chart, lab work & pertinent test results  History of Anesthesia Complications Negative for: history of anesthetic complications  Airway Mallampati: II  TM Distance: >3 FB Neck ROM: Full    Dental  (+) Dental Advisory Given, Teeth Intact   Pulmonary neg pulmonary ROS,    breath sounds clear to auscultation       Cardiovascular hypertension, Pt. on medications (-) angina Rhythm:Regular Rate:Normal     Neuro/Psych  Right leg numbness  TIAnegative psych ROS   GI/Hepatic negative GI ROS, Neg liver ROS,   Endo/Other   Obesity   Renal/GU negative Renal ROS  negative genitourinary   Musculoskeletal negative musculoskeletal ROS (+)   Abdominal   Peds  Hematology negative hematology ROS (+)   Anesthesia Other Findings   Reproductive/Obstetrics                            Anesthesia Physical Anesthesia Plan  ASA: II  Anesthesia Plan: General   Post-op Pain Management:    Induction: Intravenous  PONV Risk Score and Plan: 3 and Treatment may vary due to age or medical condition, Ondansetron, Midazolam and Dexamethasone  Airway Management Planned: Oral ETT  Additional Equipment: None  Intra-op Plan:   Post-operative Plan: Extubation in OR  Informed Consent: I have reviewed the patients History and Physical, chart, labs and discussed the procedure including the risks, benefits and alternatives for the proposed anesthesia with the patient or authorized representative who has indicated his/her understanding and acceptance.   Dental advisory given  Plan Discussed with: CRNA and Anesthesiologist  Anesthesia Plan Comments:        Anesthesia Quick Evaluation

## 2018-04-09 ENCOUNTER — Encounter (HOSPITAL_COMMUNITY): Payer: Self-pay | Admitting: Neurosurgery

## 2018-04-09 ENCOUNTER — Other Ambulatory Visit: Payer: Self-pay

## 2018-04-09 DIAGNOSIS — M48062 Spinal stenosis, lumbar region with neurogenic claudication: Secondary | ICD-10-CM | POA: Diagnosis not present

## 2018-04-09 LAB — BASIC METABOLIC PANEL
Anion gap: 11 (ref 5–15)
BUN: 10 mg/dL (ref 6–20)
CALCIUM: 9.2 mg/dL (ref 8.9–10.3)
CO2: 24 mmol/L (ref 22–32)
Chloride: 104 mmol/L (ref 98–111)
Creatinine, Ser: 1 mg/dL (ref 0.61–1.24)
GFR calc Af Amer: >60 mL/min (ref >60)
GFR calc non Af Amer: >60 mL/min (ref >60)
GLUCOSE: 143 mg/dL — AB (ref 70–99)
Potassium: 4 mmol/L (ref 3.5–5.1)
Sodium: 139 mmol/L (ref 135–145)

## 2018-04-09 LAB — CBC
HCT: 34.6 % — ABNORMAL LOW (ref 39.0–52.0)
Hemoglobin: 12.1 g/dL — ABNORMAL LOW (ref 13.0–17.0)
MCH: 29.7 pg (ref 26.0–34.0)
MCHC: 35 g/dL (ref 30.0–36.0)
MCV: 84.8 fL (ref 78.0–100.0)
Platelets: 250 10*3/uL (ref 150–400)
RBC: 4.08 MIL/uL — ABNORMAL LOW (ref 4.22–5.81)
RDW: 12.7 % (ref 11.5–15.5)
WBC: 10.7 10*3/uL — ABNORMAL HIGH (ref 4.0–10.5)

## 2018-04-09 MED ORDER — OXYCODONE HCL 5 MG PO TABS: 5.0000 mg | ORAL_TABLET | ORAL | 0 refills | Status: DC | PRN

## 2018-04-09 MED ORDER — DOCUSATE SODIUM 100 MG PO CAPS: 100.0000 mg | ORAL_CAPSULE | Freq: Two times a day (BID) | ORAL | 0 refills | Status: DC

## 2018-04-09 NOTE — Evaluation (Signed)
Physical Therapy Evaluation Patient Details Name: Tom Palmer MRN: [id number] DOB: 1960-08-20 Today's Date: 04/09/2018   History of Present Illness  Pt is s/p bilateral L2-3 and L3-4 laminotomy/foraminotomies and a left L3-4 discectomy  Clinical Impression  Patient evaluated by Physical Therapy with no further acute PT needs identified. Pt ambulating 350 feet with no assistive device; gait abnormalities and mild balance deficits noted. Recommended use of cane at home for balance. Negotiated 3 steps with left railing to prepare for discharge home. All education has been completed and the patient has no further questions. See below for any follow-up Physical Therapy or equipment needs. PT is signing off. Thank you for this referral.     Follow Up Recommendations Outpatient PT;Supervision for mobility/OOB    Equipment Recommendations  None recommended by PT    Recommendations for Other Services       Precautions / Restrictions Precautions Precautions: Fall;Back Precaution Booklet Issued: Yes (comment) Precaution Comments: reviewed and provided written handout Restrictions Weight Bearing Restrictions: No      Mobility  Bed Mobility Overal bed mobility: Modified Independent             General bed mobility comments: cues for log roll technique  Transfers Overall transfer level: Needs assistance Equipment used: None Transfers: Sit to/from Stand Sit to Stand: Min guard         General transfer comment: min guard for safety; increased time to achieve upright  Ambulation/Gait Ambulation/Gait assistance: Min guard Gait Distance (Feet): 350 Feet Assistive device: None Gait Pattern/deviations: Step-through pattern;Decreased stride length Gait velocity: decr   General Gait Details: Patient with increased left foot supination and decreased left heel strike at initial contact, able to improve with cues. Trendelenberg gait noted.   Stairs Stairs: Yes Stairs  assistance: Min guard Stair Management: One rail Left Number of Stairs: 3 General stair comments: step by step pattern; cues for sequencing  Wheelchair Mobility    Modified Rankin (Stroke Patients Only)       Balance Overall balance assessment: Mild deficits observed, not formally tested                                           Pertinent Vitals/Pain Pain Assessment: Faces Faces Pain Scale: Hurts little more Pain Location: incisional Pain Descriptors / Indicators: Operative site guarding;Grimacing Pain Intervention(s): Monitored during session    Home Living Family/patient expects to be discharged to:: Private residence Living Arrangements: Spouse/significant other Available Help at Discharge: Family Type of Home: House Home Access: Stairs to enter Entrance Stairs-Rails: Left Entrance Stairs-Number of Steps: 3 Home Layout: Laundry or work area in basement;Able to live on main level with bedroom/bathroom Home Equipment: Gilmer MorCane - single point;Shower seat      Prior Function Level of Independence: Independent with assistive device(s)         Comments: uses cane for mobility occasionally; works as Artistteam manager     Hand Dominance        Extremity/Trunk Assessment   Upper Extremity Assessment Upper Extremity Assessment: Overall WFL for tasks assessed    Lower Extremity Assessment Lower Extremity Assessment: Overall WFL for tasks assessed(some proximal weakness noted during gait)    Cervical / Trunk Assessment Cervical / Trunk Assessment: Other exceptions Cervical / Trunk Exceptions: s/p spinal sx  Communication   Communication: No difficulties  Cognition Arousal/Alertness: Awake/alert Behavior During Therapy: WFL for tasks assessed/performed  Overall Cognitive Status: Within Functional Limits for tasks assessed                                        General Comments General comments (skin integrity, edema, etc.): donned  pants independently with cues for set up    Exercises     Assessment/Plan    PT Assessment Patent does not need any further PT services  PT Problem List         PT Treatment Interventions      PT Goals (Current goals can be found in the Care Plan section)  Acute Rehab PT Goals Patient Stated Goal: "put my whole foot on the ground." PT Goal Formulation: All assessment and education complete, DC therapy    Frequency     Barriers to discharge        Co-evaluation               AM-PAC PT "6 Clicks" Daily Activity  Outcome Measure Difficulty turning over in bed (including adjusting bedclothes, sheets and blankets)?: None Difficulty moving from lying on back to sitting on the side of the bed? : None Difficulty sitting down on and standing up from a chair with arms (e.g., wheelchair, bedside commode, etc,.)?: A Little Help needed moving to and from a bed to chair (including a wheelchair)?: A Little Help needed walking in hospital room?: A Little Help needed climbing 3-5 steps with a railing? : A Little 6 Click Score: 20    End of Session Equipment Utilized During Treatment: Gait belt Activity Tolerance: Patient tolerated treatment well Patient left: in bed;with call bell/phone within reach;with family/visitor present Nurse Communication: Mobility status PT Visit Diagnosis: Unsteadiness on feet (R26.81);Other abnormalities of gait and mobility (R26.89);Pain Pain - part of body: (back)    Time: 0812-0827 PT Time Calculation (min) (ACUTE ONLY): 15 min   Charges:   PT Evaluation $PT Eval Low Complexity: 1 Low         Laurina Bustle, PT, DPT Acute Rehabilitation Services Pager [phone] Office [phone]   Tom Palmer 04/09/2018, 10:50 AM

## 2018-04-09 NOTE — Anesthesia Postprocedure Evaluation (Signed)
Anesthesia Post Note  Patient: [name]  Procedure(s) Performed: LAMINECTOMY  AND FORAMINOTOMY BILATERAL LUMBAR TWO-THREE, LUMBAR THREE-FOUR (Bilateral )     Patient location during evaluation: PACU Anesthesia Type: General Level of consciousness: awake and alert Pain management: pain level controlled Vital Signs Assessment: post-procedure vital signs reviewed and stable Respiratory status: spontaneous breathing, nonlabored ventilation and respiratory function stable Cardiovascular status: blood pressure returned to baseline and stable Postop Assessment: no apparent nausea or vomiting Anesthetic complications: no    Last Vitals:  Vitals:   04/09/18 0350 04/09/18 0721  BP: 133/81 135/76  Pulse: 89 82  Resp: 20 (!) 8  Temp: 36.4 C 36.6 C  SpO2: 95% 97%    Last Pain:  Vitals:   04/09/18 1026  TempSrc:   PainSc: 3                  Beryle Lathehomas E Brock

## 2018-04-09 NOTE — Discharge Summary (Signed)
Physician Discharge Summary  Patient ID: Tom Palmer MRN: [id number] DOB/AGE: August 23, 1960 57 y.o.  Admit date: 04/08/2018 Discharge date: 04/09/2018  Admission Diagnoses: L2-3 and L3-4 spinal stenosis, lumbar radiculopathy, neurogenic claudication, left L3-4 herniated disc, lumbago  Discharge Diagnoses: The same Active Problems:   Lumbar stenosis with neurogenic claudication   Discharged Condition: good  Hospital Course: I performed bilateral L2-3 and L3-4 laminotomy/foraminotomies and a left L3-4 discectomy on the patient on 04/08/2018.  The surgery went well.  The patient's postoperative course was unremarkable.  On postoperative day #1 he requested discharge home.  He was given oral and written discharge instructions.  All his questions were answered.  Consults: Physical therapy, Occupational Therapy Significant Diagnostic Studies: None Treatments: Bilateral L2-3 and L3-4 laminotomy/foraminotomies, left L3-4 discectomy using microdissection Discharge Exam: Blood pressure 135/76, pulse 82, temperature 97.9 F (36.6 C), temperature source Oral, resp. rate (!) 8, height 6\' 3"  (1.905 m), weight 107 kg, SpO2 97 %. Patient is alert and pleasant.  He looks well.  His lower extremity strength is normal.  Disposition: Home  Discharge Instructions    Call MD for:  difficulty breathing, headache or visual disturbances   Complete by:  As directed    Call MD for:  extreme fatigue   Complete by:  As directed    Call MD for:  hives   Complete by:  As directed    Call MD for:  persistant dizziness or light-headedness   Complete by:  As directed    Call MD for:  persistant nausea and vomiting   Complete by:  As directed    Call MD for:  redness, tenderness, or signs of infection (pain, swelling, redness, odor or green/yellow discharge around incision site)   Complete by:  As directed    Call MD for:  severe uncontrolled pain   Complete by:  As directed    Call MD for:   temperature >100.4   Complete by:  As directed    Diet - low sodium heart healthy   Complete by:  As directed    Discharge instructions   Complete by:  As directed    Call [phone] for a followup appointment. Take a stool softener while you are using pain medications.   Driving Restrictions   Complete by:  As directed    Do not drive for 2 weeks.   Increase activity slowly   Complete by:  As directed    Lifting restrictions   Complete by:  As directed    Do not lift more than 5 pounds. No excessive bending or twisting.   May shower / Bathe   Complete by:  As directed    Remove the dressing for 3 days after surgery.  You may shower, but leave the incision alone.   Remove dressing in 48 hours   Complete by:  As directed    Your stitches are under the scan and will dissolve by themselves. The Steri-Strips will fall off after you take a few showers. Do not rub back or pick at the wound, Leave the wound alone.     Allergies as of 04/09/2018   No Known Allergies     Medication List    STOP taking these medications   acetaminophen 500 MG tablet Commonly known as:  TYLENOL     TAKE these medications   amLODipine 10 MG tablet Commonly known as:  NORVASC Take 10 mg by mouth daily.   aspirin EC 81 MG tablet Take 81 mg  by mouth daily.   cyclobenzaprine 5 MG tablet Commonly known as:  FLEXERIL Take 5 mg by mouth 3 (three) times daily as needed for muscle spasms.   docusate sodium 100 MG capsule Commonly known as:  COLACE Take 1 capsule (100 mg total) by mouth 2 (two) times daily.   Fish Oil 1000 MG Caps Take 1,000 mg by mouth 2 (two) times daily.   lisinopril-hydrochlorothiazide 20-12.5 MG tablet Commonly known as:  PRINZIDE,ZESTORETIC Take 1 tablet by mouth daily.   oxyCODONE 5 MG immediate release tablet Commonly known as:  Oxy IR/ROXICODONE Take 1 tablet (5 mg total) by mouth every 4 (four) hours as needed for moderate pain ((score 4 to 6)).   tadalafil 5 MG  tablet Commonly known as:  CIALIS Take 5 mg by mouth daily as needed (enlarged prostate).        Signed: Cristi LoronJeffrey D Nikitia Asbill 04/09/2018, 7:29 AM

## 2018-04-09 NOTE — Progress Notes (Signed)
Patient is discharged from room 3C09 at this time. Alert and in stable condition. IV site d/c'd and instructions read to patient and spouse with understanding verbalized. Left unit via wheelchair with all belongings at side. 

## 2018-05-25 ENCOUNTER — Ambulatory Visit (INDEPENDENT_AMBULATORY_CARE_PROVIDER_SITE_OTHER): Payer: Managed Care, Other (non HMO) | Admitting: Urology

## 2018-05-25 DIAGNOSIS — R972 Elevated prostate specific antigen [PSA]: Secondary | ICD-10-CM | POA: Diagnosis not present

## 2018-05-25 DIAGNOSIS — N5201 Erectile dysfunction due to arterial insufficiency: Secondary | ICD-10-CM

## 2018-05-25 DIAGNOSIS — N401 Enlarged prostate with lower urinary tract symptoms: Secondary | ICD-10-CM

## 2018-05-25 DIAGNOSIS — R3912 Poor urinary stream: Secondary | ICD-10-CM

## 2019-04-26 ENCOUNTER — Ambulatory Visit (INDEPENDENT_AMBULATORY_CARE_PROVIDER_SITE_OTHER): Payer: Managed Care, Other (non HMO) | Admitting: Urology

## 2019-04-26 DIAGNOSIS — N401 Enlarged prostate with lower urinary tract symptoms: Secondary | ICD-10-CM

## 2019-04-26 DIAGNOSIS — R3915 Urgency of urination: Secondary | ICD-10-CM

## 2019-04-26 DIAGNOSIS — R972 Elevated prostate specific antigen [PSA]: Secondary | ICD-10-CM

## 2019-04-26 DIAGNOSIS — N5201 Erectile dysfunction due to arterial insufficiency: Secondary | ICD-10-CM

## 2019-04-28 ENCOUNTER — Other Ambulatory Visit (HOSPITAL_COMMUNITY): Payer: Self-pay | Admitting: Urology

## 2019-04-28 ENCOUNTER — Other Ambulatory Visit: Payer: Self-pay | Admitting: Urology

## 2019-04-28 DIAGNOSIS — R972 Elevated prostate specific antigen [PSA]: Secondary | ICD-10-CM

## 2019-06-20 ENCOUNTER — Ambulatory Visit (HOSPITAL_COMMUNITY)
Admission: RE | Admit: 2019-06-20 | Discharge: 2019-06-20 | Disposition: A | Discharge status: Home / Self Care | Payer: Managed Care, Other (non HMO) | Source: Ambulatory Visit | Attending: Urology | Admitting: Urology

## 2019-06-20 ENCOUNTER — Other Ambulatory Visit: Payer: Self-pay

## 2019-06-20 DIAGNOSIS — R972 Elevated prostate specific antigen [PSA]: Secondary | ICD-10-CM | POA: Diagnosis present

## 2019-06-20 LAB — POCT I-STAT CREATININE: Creatinine, Ser: 1.1 mg/dL (ref 0.61–1.24)

## 2019-06-20 MED ORDER — GADOBUTROL 1 MMOL/ML IV SOLN
10.0000 mL | Freq: Once | INTRAVENOUS | Status: AC | PRN
  Administered 2019-06-20: 10 mL via INTRAVENOUS

## 2019-06-20 MED ORDER — LIDOCAINE HCL URETHRAL/MUCOSAL 2 % EX GEL
CUTANEOUS | Status: AC
  Filled 2019-06-20: qty 30

## 2019-08-18 ENCOUNTER — Other Ambulatory Visit: Payer: Self-pay | Admitting: Urology

## 2019-08-18 DIAGNOSIS — R972 Elevated prostate specific antigen [PSA]: Secondary | ICD-10-CM

## 2019-08-18 NOTE — Progress Notes (Unsigned)
Biopsies essentially negative--will set up OV in 6 mos w/ PSA

## 2019-08-19 ENCOUNTER — Other Ambulatory Visit: Payer: Self-pay | Admitting: Urology

## 2019-08-19 ENCOUNTER — Telehealth: Payer: Self-pay

## 2019-08-19 NOTE — Telephone Encounter (Signed)
-----   Message from Marcine Matar, MD sent at 08/18/2019  1:34 PM EST -----   I called and left a message with negative biopsy results.  Please set up office visit in 6 months.  I put PSA order in.

## 2019-08-19 NOTE — Telephone Encounter (Signed)
Left message to return call 

## 2019-08-23 NOTE — Telephone Encounter (Signed)
Called x 2 no answer

## 2019-09-01 ENCOUNTER — Telehealth: Payer: Self-pay

## 2019-09-01 NOTE — Telephone Encounter (Signed)
-----   Message from Ferdinand Lango, RN sent at 08/18/2019  2:18 PM EST -----  ----- Message ----- From: Marcine Matar, MD Sent: 08/18/2019   1:34 PM EST To: Ferdinand Lango, RN    I called and left a message with negative biopsy results.  Please set up office visit in 6 months.  I put PSA order in.

## 2019-09-01 NOTE — Telephone Encounter (Signed)
Unable to be reached by phone. Appt. and lab order mailed.

## 2019-12-27 ENCOUNTER — Other Ambulatory Visit: Payer: Self-pay

## 2020-01-03 ENCOUNTER — Ambulatory Visit: Payer: Managed Care, Other (non HMO) | Admitting: Urology

## 2020-01-17 ENCOUNTER — Encounter: Payer: Self-pay | Admitting: Urology

## 2020-01-17 ENCOUNTER — Ambulatory Visit (INDEPENDENT_AMBULATORY_CARE_PROVIDER_SITE_OTHER): Payer: Managed Care, Other (non HMO) | Admitting: Urology

## 2020-01-17 ENCOUNTER — Other Ambulatory Visit: Payer: Self-pay

## 2020-01-17 VITALS — BP 147/70 | HR 51 | Temp 97.5°F | Ht 75.0 in | Wt 230.0 lb

## 2020-01-17 DIAGNOSIS — R350 Frequency of micturition: Secondary | ICD-10-CM | POA: Diagnosis not present

## 2020-01-17 DIAGNOSIS — R972 Elevated prostate specific antigen [PSA]: Secondary | ICD-10-CM

## 2020-01-17 DIAGNOSIS — N401 Enlarged prostate with lower urinary tract symptoms: Secondary | ICD-10-CM | POA: Diagnosis not present

## 2020-01-17 DIAGNOSIS — N521 Erectile dysfunction due to diseases classified elsewhere: Secondary | ICD-10-CM | POA: Diagnosis not present

## 2020-01-17 LAB — POCT URINALYSIS DIPSTICK
Bilirubin, UA: NEGATIVE
Blood, UA: NEGATIVE
Glucose, UA: NEGATIVE
Ketones, UA: NEGATIVE
Leukocytes, UA: NEGATIVE
Nitrite, UA: NEGATIVE
Protein, UA: POSITIVE — AB
Spec Grav, UA: <=1.005 — AB (ref 1.010–1.025)
Urobilinogen, UA: 0.2 E.U./dL
pH, UA: 8.5 — AB (ref 5.0–8.0)

## 2020-01-17 MED ORDER — FINASTERIDE 5 MG PO TABS: 5.0000 mg | ORAL_TABLET | Freq: Every day | ORAL | 11 refills | Status: DC

## 2020-01-17 MED ORDER — TADALAFIL 20 MG PO TABS: 20.0000 mg | ORAL_TABLET | ORAL | 11 refills | Status: DC | PRN

## 2020-01-17 NOTE — Progress Notes (Signed)
See progress note.

## 2020-01-17 NOTE — Progress Notes (Signed)
H&P  Chief Complaint: Elevated PSA  History of Present Illness: This man presents for follow-up of elevated PSA/BPH.  History is as follows:  TRUS/Bx performed 9.26.2014 by Dr Julien Girt volume 48 ml. PSA 6, PSAD 0.14.   10.6.2020: PSA 10.31.2019--8.6. PCA-3 (8.19.2020) 26.   11.30.2020: Prostate MRI--volume 125 ml. PIRADS 4 lesion left posterolateral/left anterior peripheral zone 20x6x14 ml.  No evidence of NVI/SV involvement, capsular invasion, lymphadenopathy, bony abnormalities.   1.22.2021:  Fusion Bx. all 4 cores from region of interest revealed benign findings.  2 cores, right base lateral, right mid lateral, revealed atypia.  1 core, left mid lateral, revealed high-grade PIN.  6.29.2021: No significant change in urinary pattern.    Her  Past Medical History:  Diagnosis Date  . Hypertension   . Stroke [facility])    tia back in 2008    Past Surgical History:  Procedure Laterality Date  . CIRCUMCISION    . LUMBAR LAMINECTOMY/DECOMPRESSION MICRODISCECTOMY Bilateral 04/08/2018   Procedure: LAMINECTOMY  AND FORAMINOTOMY BILATERAL LUMBAR TWO-THREE, LUMBAR THREE-FOUR;  Surgeon: Tressie Stalker, MD;  Location: [facility] OR;  Service: Neurosurgery;  Laterality: Bilateral;  LAMINECTOMY  AND FORAMINOTOMY BILATERAL LUMBAR TWO-THREE, LUMBAR THREE-FOUR    Home Medications:  Allergies as of 01/17/2020   No Known Allergies     Medication List       Accurate as of January 17, 2020  2:40 PM. If you have any questions, ask your nurse or doctor.        STOP taking these medications   [employer] Antacid/Anti-Gas 200-200-20 MG/5ML suspension Generic drug: alum & mag hydroxide-simeth Stopped by: Chelsea Aus, MD   [employer] Stool Softener/Laxative 8.6-50 MG tablet Generic drug: senna-docusate Stopped by: Chelsea Aus, MD   cyclobenzaprine 5 MG tablet Commonly known as: FLEXERIL Stopped by: Chelsea Aus, MD   docusate sodium 100 MG capsule Commonly known as: COLACE Stopped  by: Chelsea Aus, MD   famotidine 20 MG tablet Commonly known as: PEPCID Stopped by: Chelsea Aus, MD   levocetirizine 5 MG tablet Commonly known as: XYZAL Stopped by: Chelsea Aus, MD   lisinopril-hydrochlorothiazide 20-12.5 MG tablet Commonly known as: ZESTORETIC Stopped by: Chelsea Aus, MD   oxyCODONE 5 MG immediate release tablet Commonly known as: Oxy IR/ROXICODONE Stopped by: Chelsea Aus, MD     TAKE these medications   amLODipine 10 MG tablet Commonly known as: NORVASC Take 10 mg by mouth daily. What changed: Another medication with the same name was removed. Continue taking this medication, and follow the directions you see here. Changed by: Chelsea Aus, MD   aspirin EC 81 MG tablet Take 81 mg by mouth daily.   finasteride 5 MG tablet Commonly known as: PROSCAR Take 1 tablet (5 mg total) by mouth daily. Started by: Chelsea Aus, MD   Fish Oil 1000 MG Caps Take 1,000 mg by mouth 2 (two) times daily.   tadalafil 5 MG tablet Commonly known as: CIALIS Take 5 mg by mouth daily as needed (enlarged prostate). What changed: Another medication with the same name was added. Make sure you understand how and when to take each. Changed by: Chelsea Aus, MD   tadalafil 20 MG tablet Commonly known as: CIALIS Take 1 tablet (20 mg total) by mouth as needed for erectile dysfunction. What changed: You were already taking a medication with the same name, and this prescription was added. Make sure you understand how and when to take each. Changed by: Jeannett Senior  Elza Rafter, MD   tamsulosin 0.4 MG Caps capsule Commonly known as: FLOMAX Take 0.4 mg by mouth daily.       Allergies: No Known Allergies  History reviewed. No pertinent family history.  Social History:  reports that he has never smoked. He has never used smokeless tobacco. He reports current alcohol use of about 4.0 standard drinks of alcohol per week. He  reports that he does not use drugs.  ROS: Urological Symptom Review  Patient is experiencing the following symptoms: Leakage of urine Trouble starting stream Weak stream Erection problems (male only) Review of Systems Gastrointestinal (upper)  : Negative for upper GI symptoms Gastrointestinal (lower) : Negative for lower GI symptoms Constitutional : Negative for symptoms Skin: Negative for skin symptoms Eyes: Negative for eye symptoms Ear/Nose/Throat : Sinus problems Hematologic/Lymphatic: Negative for Hematologic/Lymphatic symptoms Cardiovascular : Negative for cardiovascular symptoms Respiratory : Negative for respiratory symptoms Endocrine: Negative for endocrine symptoms Musculoskeletal: Back pain Neurological: Negative for neurological symptoms Psychologic: Negative for psychiatric symptoms  Physical Exam:  Vital signs in last 24 hours: BP (!) 147/70   Pulse (!) 51   Temp (!) 97.5 F (36.4 C)   Ht 6\' 3"  (1.905 m)   Wt 230 lb (104.3 kg)   BMI 28.75 kg/m  Constitutional:  Alert and oriented, No acute distress Cardiovascular: Regular rate  Respiratory: Normal respiratory effort Lymphatic: No lymphadenopathy Neurologic: Grossly intact, no focal deficits Psychiatric: Normal mood and affect   Results for orders placed or performed in visit on 01/17/20 (from the past 24 hour(s))  POCT urinalysis dipstick     Status: Abnormal   Collection Time: 01/17/20 11:15 AM  Result Value Ref Range   Color, UA yellow    Clarity, UA     Glucose, UA Negative Negative   Bilirubin, UA neg    Ketones, UA neg    Spec Grav, UA <=1.005 (A) 1.010 - 1.025   Blood, UA neg    pH, UA 8.5 (A) 5.0 - 8.0   Protein, UA Positive (A) Negative   Urobilinogen, UA 0.2 0.2 or 1.0 E.U./dL   Nitrite, UA neg    Leukocytes, UA Negative Negative   Appearance clear    Odor     I have reviewed prior pt notes  I have reviewed notes from referring/previous physicians  I have reviewed  urinalysis results  I have independently reviewed prior imaging  I have reviewed prior PSA results   Impression/Assessment:   he has had an elevated PSA which is stable.  Plan:    1.  I reassured him about his exam   2.  I will see him back in about 6 months for PSA recheck.

## 2020-01-18 ENCOUNTER — Encounter: Payer: Self-pay | Admitting: Urology

## 2020-06-21 ENCOUNTER — Other Ambulatory Visit: Payer: Self-pay

## 2020-06-21 DIAGNOSIS — R972 Elevated prostate specific antigen [PSA]: Secondary | ICD-10-CM

## 2020-07-09 ENCOUNTER — Other Ambulatory Visit: Payer: Managed Care, Other (non HMO)

## 2020-07-09 ENCOUNTER — Other Ambulatory Visit: Payer: Self-pay

## 2020-07-09 DIAGNOSIS — R972 Elevated prostate specific antigen [PSA]: Secondary | ICD-10-CM

## 2020-07-10 LAB — PSA: Prostate Specific Ag, Serum: 5.7 ng/mL — ABNORMAL HIGH (ref 0.0–4.0)

## 2020-07-17 ENCOUNTER — Other Ambulatory Visit: Payer: Self-pay

## 2020-07-17 ENCOUNTER — Encounter: Payer: Self-pay | Admitting: Urology

## 2020-07-17 ENCOUNTER — Ambulatory Visit (INDEPENDENT_AMBULATORY_CARE_PROVIDER_SITE_OTHER): Payer: Managed Care, Other (non HMO) | Admitting: Urology

## 2020-07-17 VITALS — BP 157/82 | HR 53 | Temp 98.4°F | Ht 75.0 in | Wt 238.0 lb

## 2020-07-17 DIAGNOSIS — N401 Enlarged prostate with lower urinary tract symptoms: Secondary | ICD-10-CM

## 2020-07-17 DIAGNOSIS — R350 Frequency of micturition: Secondary | ICD-10-CM

## 2020-07-17 LAB — URINALYSIS, ROUTINE W REFLEX MICROSCOPIC
Bilirubin, UA: NEGATIVE
Glucose, UA: NEGATIVE
Leukocytes,UA: NEGATIVE
Nitrite, UA: NEGATIVE
Specific Gravity, UA: >1.03 — ABNORMAL HIGH (ref 1.005–1.030)
Urobilinogen, Ur: 2 mg/dL — ABNORMAL HIGH (ref 0.2–1.0)
pH, UA: 6.5 (ref 5.0–7.5)

## 2020-07-17 LAB — MICROSCOPIC EXAMINATION
Bacteria, UA: NONE SEEN
Epithelial Cells (non renal): NONE SEEN /hpf (ref 0–10)
Renal Epithel, UA: NONE SEEN /hpf
WBC, UA: NONE SEEN /hpf (ref 0–5)

## 2020-07-17 NOTE — Progress Notes (Signed)
H&P  Chief Complaint: Elevated PSA  History of Present Illness:  12.28.2021: Pt reports no significant change in LUTS. He continues on finasteride but has lapsed on tamsulosin. He notes that he has had an adequate stream during this tamsulosin vacation.  PSA: 5.7 (11.4 corrected for finasteride)  IPSS Questionnaire (AUA-7): Over the past month.   1)  How often have you had a sensation of not emptying your bladder completely after you finish urinating?  3 - About half the time  2)  How often have you had to urinate again less than two hours after you finished urinating? 3 - About half the time  3)  How often have you found you stopped and started again several times when you urinated?  3 - About half the time  4) How difficult have you found it to postpone urination?  0 - Not at all  5) How often have you had a weak urinary stream?  4 - More than half the time  6) How often have you had to push or strain to begin urination?  2 - Less than half the time  7) How many times did you most typically get up to urinate from the time you went to bed until the time you got up in the morning?  1 - 1 time  Total score:  0-7 mildly symptomatic   8-19 moderately symptomatic   20-35 severely symptomatic  IPSS: 16 QoL Score: 3   (below copied from [facility] records):   TRUS/Bx performed 9.26.2014 by Dr Julien Girt volume 48 ml. PSA 6, PSAD 0.14.   10.6.2020: PSA 10.31.2019--8.6. PCA-3 (8.19.2020) 26.    11.30.2020: MRI prostate.  Volume 125 mL.  PI-RADS 4 lesion at left posterior/anterior peripheral zone 20 x 6 x 14 mm.  There was no evidence of neurovascular/seminal vesicle/capsular involvement, no lymphadenopathy or bony abnormalities.  1.22.2021: Fusion biopsy.  Prostate volume 158 mL.  4 cores from region of interest were negative.  2 cores from systematic biopsy revealed small focus of atypia, 1 revealed high-grade PIN.   CC: I am having trouble with my erections.   HPI: He is now on to  tadalafil 5 mg daily with adequate symptomatic response.   10.6.2020: He states that 5 mg of tadalafil is now only working occasionally -- he asks about increasing his dosage.   CC: BPH   HPI: 10.6.2020: He states that he has been tolerating his urinary sx's slightly less well than before. He is most bothered by his urinary frequency/urgency during the day -- this is especially disruptive for his work schedule.    Past Medical History:  Diagnosis Date  . Hypertension   . Stroke [facility])    tia back in 2008    Past Surgical History:  Procedure Laterality Date  . CIRCUMCISION    . LUMBAR LAMINECTOMY/DECOMPRESSION MICRODISCECTOMY Bilateral 04/08/2018   Procedure: LAMINECTOMY  AND FORAMINOTOMY BILATERAL LUMBAR TWO-THREE, LUMBAR THREE-FOUR;  Surgeon: Tressie Stalker, MD;  Location: [facility];  Service: Neurosurgery;  Laterality: Bilateral;  LAMINECTOMY  AND FORAMINOTOMY BILATERAL LUMBAR TWO-THREE, LUMBAR THREE-FOUR    Home Medications:  Allergies as of 07/17/2020   No Known Allergies     Medication List       Accurate as of July 17, 2020  9:29 AM. If you have any questions, ask your nurse or doctor.        amLODipine 10 MG tablet Commonly known as: NORVASC Take 10 mg by mouth daily.   aspirin EC 81 MG  tablet Take 81 mg by mouth daily.   finasteride 5 MG tablet Commonly known as: PROSCAR Take 1 tablet (5 mg total) by mouth daily.   Fish Oil 1000 MG Caps Take 1,000 mg by mouth 2 (two) times daily.   tadalafil 5 MG tablet Commonly known as: CIALIS Take 5 mg by mouth daily as needed (enlarged prostate).   tadalafil 20 MG tablet Commonly known as: CIALIS Take 1 tablet (20 mg total) by mouth as needed for erectile dysfunction.   tamsulosin 0.4 MG Caps capsule Commonly known as: FLOMAX Take 0.4 mg by mouth daily.       Allergies: No Known Allergies  No family history on file.  Social History:  reports that he has never smoked. He has never used smokeless tobacco.  He reports current alcohol use of about 4.0 standard drinks of alcohol per week. He reports that he does not use drugs.  ROS: A complete review of systems was performed.  All systems are negative except for pertinent findings as noted.  Physical Exam:  Vital signs in last 24 hours: There were no vitals taken for this visit. Constitutional:  Alert and oriented, No acute distress Respiratory: Normal respiratory effort GI: Abdomen is soft, nontender, nondistended, no abdominal masses. No CVAT.  Genitourinary: Normal male phallus, testes are descended bilaterally and non-tender and without masses, scrotum is normal in appearance without lesions or masses, perineum is normal on inspection. Prostate feels larger than 100 grams. Neurologic: Grossly intact, no focal deficits Psychiatric: Normal mood and affect  I have reviewed prior pt notes  I have reviewed notes from referring/previous physicians  I have reviewed urinalysis results  I have independently reviewed prior imaging--MRI I have reviewed prior PSA/pathology results    Impression/Assessment:  Elevated PSA: Pt PSA is elevated but relatively stable. He requires no additional surveillance at this time but if his PSA rises significantly during the next 6 months, he will be followed up w/ MRI fusion bx  BPH: Pt LUTS are stable w/out tamsulosin but he continues on finasteride  Plan:  1. Pt discontinued on tamsulosin, continued on finasteride.  2. F/U in 6 months for OV, DRE, PSA, and symptom recheck.  CC: Dr. Lia Hopping

## 2020-07-17 NOTE — Progress Notes (Signed)
Urological Symptom Review  Patient is experiencing the following symptoms: Frequent urination Leakage of urine Stream starts and stops Erection problems (male only)   Review of Systems  Gastrointestinal (upper)  : Negative for upper GI symptoms  Gastrointestinal (lower) : Negative for lower GI symptoms  Constitutional : Negative for symptoms  Skin: Negative for skin symptoms  Eyes: Negative for eye symptoms  Ear/Nose/Throat : Sinus problems   Hematologic/Lymphatic: Negative for Hematologic/Lymphatic symptoms  Cardiovascular : Negative for cardiovascular symptoms  Respiratory : Negative for respiratory symptoms  Endocrine: Negative for endocrine symptoms  Musculoskeletal: Back pain  Neurological: Negative for neurological symptoms  Psychologic: Negative for psychiatric symptoms

## 2021-01-11 ENCOUNTER — Other Ambulatory Visit: Payer: Managed Care, Other (non HMO)

## 2021-01-11 ENCOUNTER — Other Ambulatory Visit: Payer: Self-pay

## 2021-01-11 DIAGNOSIS — N401 Enlarged prostate with lower urinary tract symptoms: Secondary | ICD-10-CM

## 2021-01-12 LAB — PSA: Prostate Specific Ag, Serum: 4.3 ng/mL — ABNORMAL HIGH (ref 0.0–4.0)

## 2021-01-14 NOTE — Progress Notes (Addendum)
History of Present Illness:  Telehealth visit today for follow-up of the below listed issues:  Elevated PSA:   TRUS/Bx performed 9.26.2014 by Dr Julien Girt volume 48 ml. PSA 6, PSAD 0.14.   10.6.2020: PSA 10.31.2019--8.6. PCA-3 (8.19.2020) 26.      11.30.2020: MRI prostate.  Volume 125 mL.  PI-RADS 4 lesion at left posterior/anterior peripheral zone 20 x 6 x 14 mm.  There was no evidence of neurovascular/seminal vesicle/capsular involvement, no lymphadenopathy or bony abnormalities.   1.22.2021: Fusion biopsy.  Prostate volume 158 mL.  4 cores from ROI were negative.  2 cores from systematic biopsy revealed small focus of atypia, 1 revealed high-grade PIN.  12.28.2021: PSA 5.7 (finasteride correction 11.4)  6.28.2022: PSA 4.3 (corrected 8.6)    ED    6.28.2022: He now takes as needed tadalafil, 20 mg at a time.  With this, he gets adequate erections.  BPH   6.28.2022: He is on dutasteride now, prescribed by Dr. Olena Leatherwood.  He feels like he has a good stream and is emptying fine.  Since his last visit here, he has had no significant urinary complaints, infections or blood in urine.  Past Medical History:  Diagnosis Date   Hypertension    Stroke (HCC)    tia back in 2008    Past Surgical History:  Procedure Laterality Date   CIRCUMCISION     LUMBAR LAMINECTOMY/DECOMPRESSION MICRODISCECTOMY Bilateral 04/08/2018   Procedure: LAMINECTOMY  AND FORAMINOTOMY BILATERAL LUMBAR TWO-THREE, LUMBAR THREE-FOUR;  Surgeon: Tressie Stalker, MD;  Location: [facility] OR;  Service: Neurosurgery;  Laterality: Bilateral;  LAMINECTOMY  AND FORAMINOTOMY BILATERAL LUMBAR TWO-THREE, LUMBAR THREE-FOUR    Home Medications:  Allergies as of 01/15/2021   No Known Allergies      Medication List        Accurate as of January 14, 2021  8:09 PM. If you have any questions, ask your nurse or doctor.          amLODipine 10 MG tablet Commonly known as: NORVASC Take 10 mg by mouth daily.   aspirin EC  81 MG tablet Take 81 mg by mouth daily.   finasteride 5 MG tablet Commonly known as: PROSCAR Take 1 tablet (5 mg total) by mouth daily.   Fish Oil 1000 MG Caps Take 1,000 mg by mouth 2 (two) times daily.   tadalafil 5 MG tablet Commonly known as: CIALIS Take 5 mg by mouth daily as needed (enlarged prostate).   tadalafil 20 MG tablet Commonly known as: CIALIS Take 1 tablet (20 mg total) by mouth as needed for erectile dysfunction.   tamsulosin 0.4 MG Caps capsule Commonly known as: FLOMAX Take 0.4 mg by mouth daily.        Allergies: No Known Allergies  No family history on file.  Social History:  reports that he has never smoked. He has never used smokeless tobacco. He reports current alcohol use of about 4.0 standard drinks of alcohol per week. He reports that he does not use drugs.  ROS: A complete review of systems was performed.  All systems are negative except for pertinent findings as noted.   I have reviewed prior pt notes  I have reviewed notes from referring/previous physician  I have independently reviewed prior pathology results  I have reviewed prior PSA results  Impression/Assessment:  1.  BPH.  He has a very large gland.  He seems to do well with his current dose of dutasteride.  2.  Elevated PSA.  Negative biopsy in the  past.  Prostate size about 140 mL.  Stable/slightly lower PSA  3.  ED, doing well on tadalafil  Plan:  1.  We will cancel his appointment for August  2.  I will see back in 6 months with PSA  3.  Continue on dutasteride  4.  I refilled tadalafil  I connected with  Dub Amis on 01/15/21 by a video enabled telemedicine application and verified that I am speaking with the correct person using two identifiers.   I discussed the limitations of evaluation and management by telemedicine. The patient expressed understanding and agreed to proceed.  Pt seen in context of Covid 19 pandemic  Pt location: Home My  location: Home office Persons on call: Patient and myself  17 minutes were spent on the phone with the patient

## 2021-01-15 ENCOUNTER — Ambulatory Visit (INDEPENDENT_AMBULATORY_CARE_PROVIDER_SITE_OTHER): Payer: Managed Care, Other (non HMO) | Admitting: Urology

## 2021-01-15 DIAGNOSIS — R972 Elevated prostate specific antigen [PSA]: Secondary | ICD-10-CM | POA: Diagnosis not present

## 2021-01-15 DIAGNOSIS — N521 Erectile dysfunction due to diseases classified elsewhere: Secondary | ICD-10-CM

## 2021-01-15 MED ORDER — TADALAFIL 20 MG PO TABS: 20.0000 mg | ORAL_TABLET | ORAL | 11 refills | Status: DC | PRN

## 2021-01-30 ENCOUNTER — Telehealth: Payer: Self-pay | Admitting: Urology

## 2021-01-30 ENCOUNTER — Other Ambulatory Visit: Payer: Self-pay

## 2021-01-30 DIAGNOSIS — N521 Erectile dysfunction due to diseases classified elsewhere: Secondary | ICD-10-CM

## 2021-01-30 DIAGNOSIS — N401 Enlarged prostate with lower urinary tract symptoms: Secondary | ICD-10-CM

## 2021-01-30 MED ORDER — TADALAFIL 20 MG PO TABS: 20.0000 mg | ORAL_TABLET | ORAL | 11 refills | Status: DC | PRN

## 2021-01-30 NOTE — Telephone Encounter (Signed)
Pt called wanting to check on a prescription that [facility] was supposed to send in for him. Please call when you can.

## 2021-01-30 NOTE — Telephone Encounter (Signed)
Spoke with patient. Tadalafil rx sent to [facility] as requested.

## 2021-02-12 ENCOUNTER — Encounter: Payer: Self-pay | Admitting: Urology

## 2021-02-19 ENCOUNTER — Ambulatory Visit: Payer: Managed Care, Other (non HMO) | Admitting: Urology

## 2021-07-08 ENCOUNTER — Other Ambulatory Visit: Payer: Self-pay

## 2021-07-08 ENCOUNTER — Other Ambulatory Visit: Payer: Managed Care, Other (non HMO)

## 2021-07-08 DIAGNOSIS — N521 Erectile dysfunction due to diseases classified elsewhere: Secondary | ICD-10-CM

## 2021-07-09 LAB — PSA: Prostate Specific Ag, Serum: 3.9 ng/mL (ref 0.0–4.0)

## 2021-07-15 NOTE — Progress Notes (Addendum)
History of Present Illness: Pt presents for followup of elevated PSA, BPH as well as ED.  Elevated PSA:  TRUS/Bx performed 9.26.2014 by Dr Julien Girt volume 48 ml. PSA 6, PSAD 0.14.  10.6.2020: PSA 10.31.2019--8.6. PCA-3 (8.19.2020) 26.    11.30.2020: MRI prostate.  Volume 125 mL.  PI-RADS 4 lesion at left posterior/anterior peripheral zone 20 x 6 x 14 mm.  There was no evidence of neurovascular/seminal vesicle/capsular involvement, no lymphadenopathy or bony abnormalities.   1.22.2021: Fusion biopsy.  Prostate volume 158 mL.  4 cores from ROI were negative.  2 cores from systematic biopsy revealed small focus of atypia, 1 revealed high-grade PIN.   12.28.2021: PSA 5.7 (finasteride correction 11.4) 6.28.2022: PSA 4.3 (corrected 8.6)  12.27.2022: PSA 3.9 (corrected 7.8).  On dutasteride   ED     12.27.2022: On Cialis 20 mg.  Does not always work appropriately.  BPH   12.27.2022: Still on dutasteride.  He is having a bit of a worse time with urinary urgency and rare urgency incontinence. IPSS 17, quality-of-life score 2.  He is not on tamsulosin.    Past Medical History:  Diagnosis Date   Hypertension    Stroke (HCC)    tia back in 2008    Past Surgical History:  Procedure Laterality Date   CIRCUMCISION     LUMBAR LAMINECTOMY/DECOMPRESSION MICRODISCECTOMY Bilateral 04/08/2018   Procedure: LAMINECTOMY  AND FORAMINOTOMY BILATERAL LUMBAR TWO-THREE, LUMBAR THREE-FOUR;  Surgeon: Tressie Stalker, MD;  Location: [facility] OR;  Service: Neurosurgery;  Laterality: Bilateral;  LAMINECTOMY  AND FORAMINOTOMY BILATERAL LUMBAR TWO-THREE, LUMBAR THREE-FOUR    Home Medications:  Allergies as of 07/16/2021   No Known Allergies      Medication List        Accurate as of July 15, 2021  5:00 PM. If you have any questions, ask your nurse or doctor.          amLODipine 10 MG tablet Commonly known as: NORVASC Take 10 mg by mouth daily.   aspirin EC 81 MG tablet Take 81 mg by  mouth daily.   finasteride 5 MG tablet Commonly known as: PROSCAR Take 1 tablet (5 mg total) by mouth daily.   Fish Oil 1000 MG Caps Take 1,000 mg by mouth 2 (two) times daily.   tadalafil 20 MG tablet Commonly known as: CIALIS Take 1 tablet (20 mg total) by mouth as needed for erectile dysfunction.   tamsulosin 0.4 MG Caps capsule Commonly known as: FLOMAX Take 0.4 mg by mouth daily.        Allergies: No Known Allergies  No family history on file.  Social History:  reports that he has never smoked. He has never used smokeless tobacco. He reports current alcohol use of about 4.0 standard drinks per week. He reports that he does not use drugs.  ROS: A complete review of systems was performed.  All systems are negative except for pertinent findings as noted.  Physical Exam:  Vital signs in last 24 hours: There were no vitals taken for this visit. Constitutional:  Alert and oriented, No acute distress Cardiovascular: Regular rate  Respiratory: Normal respiratory effort GI: Abdomen is soft, nontender, nondistended, no abdominal masses. No CVAT.  Genitourinary: Normal male phallus, testes are descended bilaterally and non-tender and without masses, scrotum is normal in appearance without lesions or masses, perineum is normal on inspection.  Normal anal sphincter tone.  Prostate large, symmetrical.  No nodularity. Lymphatic: No lymphadenopathy Neurologic: Grossly intact, no focal deficits Psychiatric: Normal mood  and affect  I have reviewed prior pt notes  I have reviewed urinalysis results  I have reviewed prior PSA results  I have reviewed IPSS results  Post void residual by bladder scan-72 mL   Impression/Assessment:  1.  BPH with large gland, on dutasteride but not on tamsulosin anymore.  Symptoms a bit worse, IPSS 17.  2.  ED, getting a bit worse.  20 mg of Cialis does not always cut it  3.  Elevated PSA, lower, expected decrease on dutasteride  Plan:  1.   Decrease amount of caffeine in diet  2.  I will change his Cialis to daily 5 mg  3.  Continue dutasteride.  If symptoms still significantly bothersome at next visit which will be in 6 months, consider adding back tamsulosin  4.  I will see back in 6 months with laboratories  CC: Dr Loma Sousa

## 2021-07-16 ENCOUNTER — Ambulatory Visit (INDEPENDENT_AMBULATORY_CARE_PROVIDER_SITE_OTHER): Payer: Managed Care, Other (non HMO) | Admitting: Urology

## 2021-07-16 ENCOUNTER — Other Ambulatory Visit: Payer: Self-pay

## 2021-07-16 ENCOUNTER — Encounter: Payer: Self-pay | Admitting: Urology

## 2021-07-16 VITALS — BP 156/79 | HR 77

## 2021-07-16 DIAGNOSIS — R972 Elevated prostate specific antigen [PSA]: Secondary | ICD-10-CM

## 2021-07-16 DIAGNOSIS — N529 Male erectile dysfunction, unspecified: Secondary | ICD-10-CM

## 2021-07-16 DIAGNOSIS — N401 Enlarged prostate with lower urinary tract symptoms: Secondary | ICD-10-CM

## 2021-07-16 DIAGNOSIS — R350 Frequency of micturition: Secondary | ICD-10-CM | POA: Diagnosis not present

## 2021-07-16 DIAGNOSIS — N521 Erectile dysfunction due to diseases classified elsewhere: Secondary | ICD-10-CM

## 2021-07-16 LAB — URINALYSIS, ROUTINE W REFLEX MICROSCOPIC
Bilirubin, UA: NEGATIVE
Glucose, UA: NEGATIVE
Ketones, UA: NEGATIVE
Leukocytes,UA: NEGATIVE
Nitrite, UA: NEGATIVE
RBC, UA: NEGATIVE
Specific Gravity, UA: 1.015 (ref 1.005–1.030)
Urobilinogen, Ur: 0.2 mg/dL (ref 0.2–1.0)
pH, UA: 7 (ref 5.0–7.5)

## 2021-07-16 MED ORDER — TADALAFIL 5 MG PO TABS: ORAL_TABLET | ORAL | 3 refills | Status: DC

## 2021-07-16 NOTE — Progress Notes (Signed)
Urological Symptom Review  Patient is experiencing the following symptoms: Frequent urination Hard to postpone urination Leakage of urine Stream starts and stops Erection problems (male only)   Review of Systems  Gastrointestinal (upper)  : Negative for upper GI symptoms  Gastrointestinal (lower) : Negative for lower GI symptoms  Constitutional : Negative for symptoms  Skin: Negative for skin symptoms  Eyes: Negative for eye symptoms  Ear/Nose/Throat : Sinus problems  Hematologic/Lymphatic: Negative for Hematologic/Lymphatic symptoms  Cardiovascular : Negative for cardiovascular symptoms  Respiratory : Negative for respiratory symptoms  Endocrine: Negative for endocrine symptoms  Musculoskeletal: Back pain Joint pain  Neurological: Negative for neurological symptoms  Psychologic: Negative for psychiatric symptoms

## 2021-10-31 ENCOUNTER — Encounter: Payer: Self-pay | Admitting: Orthopaedic Surgery

## 2021-10-31 ENCOUNTER — Ambulatory Visit (INDEPENDENT_AMBULATORY_CARE_PROVIDER_SITE_OTHER): Payer: Managed Care, Other (non HMO) | Admitting: Orthopaedic Surgery

## 2021-10-31 VITALS — Ht 75.0 in | Wt 227.0 lb

## 2021-10-31 DIAGNOSIS — M25511 Pain in right shoulder: Secondary | ICD-10-CM

## 2021-10-31 DIAGNOSIS — M25521 Pain in right elbow: Secondary | ICD-10-CM

## 2021-10-31 DIAGNOSIS — G8929 Other chronic pain: Secondary | ICD-10-CM | POA: Diagnosis not present

## 2021-10-31 NOTE — Progress Notes (Signed)
? ?Office Visit Note ?  ?Patient: Tom Palmer           ?Date of Birth: 1960-11-11           ?MRN: [id number] ?Visit Date: 10/31/2021 ?             ?Requested by: Tom Deiters, MD ?[address] ?[place],  [place] [address] ?PCP: Tom Deiters, MD ? ? ?Assessment & Plan: ?Visit Diagnoses: No diagnosis found. ? ?Plan: Work slip given no pulling lifting right arm pending return office visit after MRI scan.  We will proceed with MRI right elbow to rule out distal biceps tendinopathy or partial tearing.  He also needs an MRI scan of right shoulder due to his impingement and history of persistent shoulder pain with rotator cuff weakness.  His shoulder problem is more likely chronic. ? ?Follow-Up Instructions: No follow-ups on file.  ? ?Orders:  ?No orders of the defined types were placed in this encounter. ? ?No orders of the defined types were placed in this encounter. ? ? ? ? Procedures: ?No procedures performed ? ? ?Clinical Data: ?No additional findings. ? ? ?Subjective: ?Chief Complaint  ?Patient presents with  ? Right Arm - Pain  ? ? ?HPI 61 year old male new patient visit referred by Tom Palmer for right shoulder and right arm pain.  Onset 2 weeks ago.  Has had pain shooting down his arm and then noticed some bruising in the antecubital and in the forearm.  He noticed that he had some bruising mid humerus level.  He went to the ED at [facility] had a CT they are concerned he might have damaged his biceps.  Had some throbbing while in bed.  He has noticed increased pain with pulling.  Patient states in the past he has not been able to use his arm very well up over his head.  Pain with outstretch reaching he is right-hand dominant.  CT scan 10/09/2021 showed soft tissue edema in the subcutaneous tissue of the right arm at the level of mid humerus extending to the proximal forearm.  Additionally had some arthropathy at the wrist partially imaged and degenerative changes in the shoulder and elbow with  small glenohumeral joint effusion. ? ?Patient's had some hypertension history of CVA.  He is on baby aspirin.  He has used topical get gel diclofenac on his arm without improvement. ? ?Review of Systems previous gallbladder surgery lumbar laminectomy 2019.  Negative for smoking.  No dyspnea.  All systems are noncontributory HPI. ? ? ?Objective: ?Vital Signs: Ht 6\' 3"  (1.905 m)   Wt 227 lb (103 kg)   BMI 28.37 kg/m?  ? ?Physical Exam ?Constitutional:   ?   Appearance: He is well-developed.  ?HENT:  ?   Head: Normocephalic and atraumatic.  ?   Right Ear: External ear normal.  ?   Left Ear: External ear normal.  ?Eyes:  ?   Pupils: Pupils are equal, round, and reactive to light.  ?Neck:  ?   Thyroid: No thyromegaly.  ?   Trachea: No tracheal deviation.  ?Cardiovascular:  ?   Rate and Rhythm: Normal rate.  ?Pulmonary:  ?   Effort: Pulmonary effort is normal.  ?   Breath sounds: No wheezing.  ?Abdominal:  ?   General: Bowel sounds are normal.  ?   Palpations: Abdomen is soft.  ?Musculoskeletal:  ?   Cervical back: Neck supple.  ?Skin: ?   General: Skin is warm and dry.  ?  Capillary Refill: Capillary refill takes less than 2 seconds.  ?Neurological:  ?   Mental Status: He is alert and oriented to person, place, and time.  ?Psychiatric:     ?   Behavior: Behavior normal.     ?   Thought Content: Thought content normal.     ?   Judgment: Judgment normal.  ? ? ?Ortho Exam patient has tenderness distal biceps tendon in the antecubital fossa but it is intact.  Exquisitely tender with resistive testing.  No palpable partial defect does not appear to be thickened.  There is some ecchymosis just distal to the biceps tendon on the volar forearm.  Mid biceps is tender minimal tenderness bicipital groove.  Positive impingement pain with resisted drop arm test and some supraspinatus weakness.  Subscap is strong external rotation is strong. ? ?Specialty Comments:  ?No specialty comments available. ? ?Imaging: ?No results  found. ? ? ?PMFS History: ?Patient Active Problem List  ? Diagnosis Date Noted  ? Lumbar stenosis with neurogenic claudication 04/08/2018  ? ?Past Medical History:  ?Diagnosis Date  ? Hypertension   ? Stroke [facility])   ? tia back in 2008  ?  ?No family history on file.  ?Past Surgical History:  ?Procedure Laterality Date  ? CIRCUMCISION    ? LUMBAR LAMINECTOMY/DECOMPRESSION MICRODISCECTOMY Bilateral 04/08/2018  ? Procedure: LAMINECTOMY  AND FORAMINOTOMY BILATERAL LUMBAR TWO-THREE, LUMBAR THREE-FOUR;  Surgeon: Tom Stalker, MD;  Location: [facility] OR;  Service: Neurosurgery;  Laterality: Bilateral;  LAMINECTOMY  AND FORAMINOTOMY BILATERAL LUMBAR TWO-THREE, LUMBAR THREE-FOUR  ? ?Social History  ? ?Occupational History  ? Not on file  ?Tobacco Use  ? Smoking status: Never  ? Smokeless tobacco: Never  ?Vaping Use  ? Vaping Use: Never used  ?Substance and Sexual Activity  ? Alcohol use: Yes  ?  Alcohol/week: 4.0 standard drinks  ?  Types: 3 Cans of beer, 1 Shots of liquor per week  ? Drug use: Never  ? Sexual activity: Not on file  ? ? ? ? ? ? ?

## 2021-11-05 ENCOUNTER — Telehealth: Payer: Self-pay | Admitting: Radiology

## 2021-11-05 NOTE — Telephone Encounter (Signed)
Received notification from [place] in [place] office that both MRI elbow and MRI shoulder were denied authorization by insurance due to not completing six weeks of provider directed treatment. Would you like to set up peer to peer or order PT? ?

## 2021-11-05 NOTE — Telephone Encounter (Signed)
Message sent to [facility] to schedule peer to peer. Needs to be done either tomorrow, or next Tuesday when he is back in the office. ?

## 2021-11-05 NOTE — Telephone Encounter (Signed)
FYI ? ?Peer to peer scheduled for tomorrow at 1130. ?

## 2021-11-06 NOTE — Telephone Encounter (Signed)
Peer to peer completed. MRI elbow has been approved, however MRI shoulder is still denied.  ?Approval authorization for MRI elbow is [id number]. ?Approval information sent to [facility] in [place] office to get scheduled.  ?

## 2021-11-26 ENCOUNTER — Encounter: Payer: Self-pay | Admitting: Orthopaedic Surgery

## 2021-11-26 ENCOUNTER — Ambulatory Visit (INDEPENDENT_AMBULATORY_CARE_PROVIDER_SITE_OTHER): Payer: Managed Care, Other (non HMO) | Admitting: Orthopaedic Surgery

## 2021-11-26 DIAGNOSIS — M25521 Pain in right elbow: Secondary | ICD-10-CM

## 2021-11-26 NOTE — Progress Notes (Signed)
? ?Office Visit Note ?  ?Patient: Tom Palmer           ?Date of Birth: June 01, 1961           ?MRN: [id number] ?Visit Date: 11/26/2021 ?             ?Requested by: Tom Deiters, MD ?[address] ?[place],  [place] [address] ?PCP: Tom Deiters, MD ? ? ?Assessment & Plan: ?Visit Diagnoses:  ?1. Pain in right elbow   ? ? ?Plan: Reviewed MRI scan with patient discussed with him that findings show some distal biceps insertional tendinopathy.  He also has some tendinopathy of the lateral epicondyle at the extensor origin.  He continues to have problems with the shoulder and likely has some rotator cuff pathology but MRI was not approved.  Work slip given that he can continue work with limited pulling lifting right arm x2 months okay to use his left arm and hand.  I plan to recheck him again in 2 months. ? ?Follow-Up Instructions: Return in about 2 months (around 01/26/2022).  ? ?Orders:  ?No orders of the defined types were placed in this encounter. ? ?No orders of the defined types were placed in this encounter. ? ? ? ? Procedures: ?No procedures performed ? ? ?Clinical Data: ?No additional findings. ? ? ?Subjective: ?Chief Complaint  ?Patient presents with  ? Right Elbow - Pain, Follow-up  ?  MRI review  ? ? ?HPI 61 year old male returns for ongoing problems with right shoulder pain right elbow pain.  Onset was 2 weeks prior to his fourth/13/23 office visit.  Had some bruising in the antecubital region pain with resisted elbow flexion and points over the biceps tendon where he is having pain.  He describes throbbing pain at times.  Sometimes he has noticed tingling into his fingers.  He is use the sling for his right arm at times.  He also has pain in the shoulder and states the MRI scan was denied for his shoulder for Worker's Comp.  MRI scan was elbow has been obtained and is available for review. ? ?Review of Systems review of systems updated unchanged from 10/31/2021 office visit.  History of CVA on baby  aspirin.  Previous gallbladder surgery lumbar laminectomy.  All the systems noncontributory to HPI. ? ? ?Objective: ?Vital Signs: BP 133/76   Pulse 90   Ht 6\' 3"  (1.905 m)   Wt 227 lb (103 kg)   BMI 28.37 kg/m?  ? ?Physical Exam ?Constitutional:   ?   Appearance: He is well-developed.  ?HENT:  ?   Head: Normocephalic and atraumatic.  ?   Right Ear: External ear normal.  ?   Left Ear: External ear normal.  ?Eyes:  ?   Pupils: Pupils are equal, round, and reactive to light.  ?Neck:  ?   Thyroid: No thyromegaly.  ?   Trachea: No tracheal deviation.  ?Cardiovascular:  ?   Rate and Rhythm: Normal rate.  ?Pulmonary:  ?   Effort: Pulmonary effort is normal.  ?   Breath sounds: No wheezing.  ?Abdominal:  ?   General: Bowel sounds are normal.  ?   Palpations: Abdomen is soft.  ?Musculoskeletal:  ?   Cervical back: Neck supple.  ?Skin: ?   General: Skin is warm and dry.  ?   Capillary Refill: Capillary refill takes less than 2 seconds.  ?Neurological:  ?   Mental Status: He is alert and oriented to person, place, and  time.  ?Psychiatric:     ?   Behavior: Behavior normal.     ?   Thought Content: Thought content normal.     ?   Judgment: Judgment normal.  ? ? ?Ortho Exam patient still has tenderness over the distal biceps tendon in the antecubital space not as significant and is tender as it was last month.  He does have full elbow extension.  Still has positive impingement right shoulder.  No distal biceps migration.  Ecchymosis over the biceps and distal arm above the antecubital space has resolved.  Minimal tenderness of the biceps tendon in the bicipital groove anteriorly over the shoulder.  Still has positive impingement and some supraspinatus weakness suggesting some rotator cuff pathology. ? ?Specialty Comments:  ?No specialty comments available. ? ?Imaging: ?Impression ? ?1. Moderate to high-grade partial-thickness midsubstance tear of the  ?common extensor tendon origin. No tendon retraction.  ?2. Minimal distal  biceps insertional tendinosis.  ?3. Mild fluid at the deep aspect of the subcutaneous fat of the  ?posterior elbow.  ?4. Mild medial greater than lateral elbow cartilage degenerative  ?changes.  ? ? ?Electronically Signed  ?  By: Neita Garnet M.D.  ?  On: 11/19/2021 15:41 ? ? ?PMFS History: ?Patient Active Problem List  ? Diagnosis Date Noted  ? Pain in right elbow 12/03/2021  ? Lumbar stenosis with neurogenic claudication 04/08/2018  ? ?Past Medical History:  ?Diagnosis Date  ? Hypertension   ? Stroke [facility])   ? tia back in 2008  ?  ?No family history on file.  ?Past Surgical History:  ?Procedure Laterality Date  ? CIRCUMCISION    ? LUMBAR LAMINECTOMY/DECOMPRESSION MICRODISCECTOMY Bilateral 04/08/2018  ? Procedure: LAMINECTOMY  AND FORAMINOTOMY BILATERAL LUMBAR TWO-THREE, LUMBAR THREE-FOUR;  Surgeon: Tressie Stalker, MD;  Location: [facility] OR;  Service: Neurosurgery;  Laterality: Bilateral;  LAMINECTOMY  AND FORAMINOTOMY BILATERAL LUMBAR TWO-THREE, LUMBAR THREE-FOUR  ? ?Social History  ? ?Occupational History  ? Not on file  ?Tobacco Use  ? Smoking status: Never  ? Smokeless tobacco: Never  ?Vaping Use  ? Vaping Use: Never used  ?Substance and Sexual Activity  ? Alcohol use: Yes  ?  Alcohol/week: 4.0 standard drinks  ?  Types: 3 Cans of beer, 1 Shots of liquor per week  ? Drug use: Never  ? Sexual activity: Not on file  ? ? ? ? ? ? ?

## 2021-12-03 DIAGNOSIS — M25521 Pain in right elbow: Secondary | ICD-10-CM | POA: Insufficient documentation

## 2022-01-14 ENCOUNTER — Encounter: Payer: Self-pay | Admitting: Urology

## 2022-01-14 ENCOUNTER — Ambulatory Visit (INDEPENDENT_AMBULATORY_CARE_PROVIDER_SITE_OTHER): Payer: Managed Care, Other (non HMO) | Admitting: Urology

## 2022-01-14 VITALS — BP 133/68 | HR 59

## 2022-01-14 DIAGNOSIS — N401 Enlarged prostate with lower urinary tract symptoms: Secondary | ICD-10-CM

## 2022-01-14 DIAGNOSIS — R350 Frequency of micturition: Secondary | ICD-10-CM

## 2022-01-14 DIAGNOSIS — R972 Elevated prostate specific antigen [PSA]: Secondary | ICD-10-CM

## 2022-01-14 DIAGNOSIS — N521 Erectile dysfunction due to diseases classified elsewhere: Secondary | ICD-10-CM | POA: Diagnosis not present

## 2022-01-14 LAB — URINALYSIS, ROUTINE W REFLEX MICROSCOPIC
Bilirubin, UA: NEGATIVE
Glucose, UA: NEGATIVE
Ketones, UA: NEGATIVE
Leukocytes,UA: NEGATIVE
Nitrite, UA: NEGATIVE
Protein,UA: NEGATIVE
RBC, UA: NEGATIVE
Specific Gravity, UA: 1.015 (ref 1.005–1.030)
Urobilinogen, Ur: 0.2 mg/dL (ref 0.2–1.0)
pH, UA: 5 (ref 5.0–7.5)

## 2022-01-15 LAB — PSA: Prostate Specific Ag, Serum: 3.9 ng/mL (ref 0.0–4.0)

## 2022-01-27 NOTE — Progress Notes (Signed)
Letter sent.

## 2022-01-28 ENCOUNTER — Encounter: Payer: Self-pay | Admitting: Orthopaedic Surgery

## 2022-01-28 ENCOUNTER — Ambulatory Visit (INDEPENDENT_AMBULATORY_CARE_PROVIDER_SITE_OTHER): Payer: Managed Care, Other (non HMO) | Admitting: Orthopaedic Surgery

## 2022-01-28 VITALS — BP 123/73 | HR 43 | Ht 75.0 in | Wt 227.0 lb

## 2022-01-28 DIAGNOSIS — M5136 Other intervertebral disc degeneration, lumbar region: Secondary | ICD-10-CM | POA: Diagnosis not present

## 2022-01-28 DIAGNOSIS — M25521 Pain in right elbow: Secondary | ICD-10-CM | POA: Diagnosis not present

## 2022-02-05 DIAGNOSIS — M5136 Other intervertebral disc degeneration, lumbar region: Secondary | ICD-10-CM | POA: Insufficient documentation

## 2022-02-05 NOTE — Progress Notes (Signed)
Office Visit Note   Patient: Tom Palmer           Date of Birth: 03/04/1961           MRN: [id number] Visit Date: 01/28/2022              Requested by: Toma Deiters, MD [address] [place],  [place] [address] PCP: Toma Deiters, MD   Assessment & Plan: Visit Diagnoses:  1. Pain in right elbow   2. Other intervertebral disc degeneration, lumbar region     Plan: Patient's right upper extremity doing better we discussed tendinopathy biceps tendon lateral condyle and activities are most likely bother it.  Previous lumbar decompression.  We will check him if he has increased problems.  Follow-Up Instructions: No follow-ups on file.   Orders:  No orders of the defined types were placed in this encounter.  No orders of the defined types were placed in this encounter.     Procedures: No procedures performed   Clinical Data: No additional findings.   Subjective: Chief Complaint  Patient presents with   Right Elbow - Pain, Follow-up    HPI 61 year old male with low back pain.  Previous surgery by Dr. Tressie Stalker 2019 with decompression L2-3 and L3-4.  Patient has had problems with pain in his right elbow and shoulder with visit 11/26/2021.  Patient has some tenderness and tendinopathy lateral epicondylitis.  His symptoms do improve with less pulling and lifting with his right arm.  He is continue to have problems with back symptoms.  No associated bowel or bladder symptoms no fever or chills.  Review of Systems 14 point system update unchanged from 11/26/2021.   Objective: Vital Signs: BP 123/73   Pulse (!) 43   Ht 6\' 3"  (1.905 m)   Wt 227 lb (103 kg)   BMI 28.37 kg/m   Physical Exam Constitutional:      Appearance: He is well-developed.  HENT:     Head: Normocephalic and atraumatic.     Right Ear: External ear normal.     Left Ear: External ear normal.  Eyes:     Pupils: Pupils are equal, round, and reactive to light.  Neck:     Thyroid: No  thyromegaly.     Trachea: No tracheal deviation.  Cardiovascular:     Rate and Rhythm: Normal rate.  Pulmonary:     Effort: Pulmonary effort is normal.     Breath sounds: No wheezing.  Abdominal:     General: Bowel sounds are normal.     Palpations: Abdomen is soft.  Musculoskeletal:     Cervical back: Neck supple.  Skin:    General: Skin is warm and dry.     Capillary Refill: Capillary refill takes less than 2 seconds.  Neurological:     Mental Status: He is alert and oriented to person, place, and time.  Psychiatric:        Behavior: Behavior normal.        Thought Content: Thought content normal.        Judgment: Judgment normal.     Ortho Exam minimal tenderness over the lateral condyle distal biceps tendon minimal impingement right shoulder.  Good range of motion left shoulder.  No ecchymosis.  Knee and ankle jerk are intact negative logroll the hips knees reach full extension.  Specialty Comments:  No specialty comments available.  Imaging: No results found.   PMFS History: Patient Active Problem List   Diagnosis  Date Noted   Other intervertebral disc degeneration, lumbar region 02/05/2022   Pain in right elbow 12/03/2021   Lumbar stenosis with neurogenic claudication 04/08/2018   Past Medical History:  Diagnosis Date   Hypertension    Stroke [facility])    tia back in 2008    History reviewed. No pertinent family history.  Past Surgical History:  Procedure Laterality Date   CIRCUMCISION     LUMBAR LAMINECTOMY/DECOMPRESSION MICRODISCECTOMY Bilateral 04/08/2018   Procedure: LAMINECTOMY  AND FORAMINOTOMY BILATERAL LUMBAR TWO-THREE, LUMBAR THREE-FOUR;  Surgeon: Tressie Stalker, MD;  Location: [facility] OR;  Service: Neurosurgery;  Laterality: Bilateral;  LAMINECTOMY  AND FORAMINOTOMY BILATERAL LUMBAR TWO-THREE, LUMBAR THREE-FOUR   Social History   Occupational History   Not on file  Tobacco Use   Smoking status: Never   Smokeless tobacco: Never  Vaping Use   Vaping  Use: Never used  Substance and Sexual Activity   Alcohol use: Yes    Alcohol/week: 4.0 standard drinks of alcohol    Types: 3 Cans of beer, 1 Shots of liquor per week   Drug use: Never   Sexual activity: Not on file

## 2022-07-01 ENCOUNTER — Other Ambulatory Visit: Payer: Managed Care, Other (non HMO)

## 2022-07-02 ENCOUNTER — Other Ambulatory Visit: Payer: Managed Care, Other (non HMO)

## 2022-07-03 LAB — PSA: Prostate Specific Ag, Serum: 3.1 ng/mL (ref 0.0–4.0)

## 2022-07-08 ENCOUNTER — Encounter: Payer: Self-pay | Admitting: Urology

## 2022-07-08 ENCOUNTER — Ambulatory Visit: Payer: Managed Care, Other (non HMO) | Admitting: Urology

## 2022-07-08 DIAGNOSIS — N521 Erectile dysfunction due to diseases classified elsewhere: Secondary | ICD-10-CM | POA: Diagnosis not present

## 2022-07-08 DIAGNOSIS — N401 Enlarged prostate with lower urinary tract symptoms: Secondary | ICD-10-CM

## 2022-07-08 DIAGNOSIS — R350 Frequency of micturition: Secondary | ICD-10-CM | POA: Diagnosis not present

## 2022-07-08 DIAGNOSIS — R972 Elevated prostate specific antigen [PSA]: Secondary | ICD-10-CM

## 2022-07-08 MED ORDER — TADALAFIL 5 MG PO TABS: ORAL_TABLET | ORAL | 3 refills | Status: AC

## 2022-07-08 NOTE — Progress Notes (Signed)
History of Present Illness: Pt presents for followup of elevated PSA, BPH as well as ED.   Elevated PSA:   TRUS/Bx performed 9.26.2014 by Dr Julien Girt volume 48 ml. PSA 6, PSAD 0.14.  10.6.2020: PSA 10.31.2019--8.6. PCA-3 (8.19.2020) 26.    11.30.2020: MRI prostate.  Volume 125 mL.  PI-RADS 4 lesion at left posterior/anterior peripheral zone 20 x 6 x 14 mm.  There was no evidence of neurovascular/seminal vesicle/capsular involvement, no lymphadenopathy or bony abnormalities.   1.22.2021: Fusion biopsy.  Prostate volume 158 mL.  4 cores from ROI were negative.  2 cores from systematic biopsy revealed small focus of atypia, 1 revealed high-grade PIN.   12.28.2021: PSA 5.7 (finasteride correction 11.4) 6.28.2022: PSA 4.3 (corrected 8.6) 12.27.2022: PSA 3.9 (corrected 7.8).  On dutasteride 6.27.2023: PSA 3.9 (corrected 7.8)  12.19.2023: PSA 3.1.  Still on daily Cialis 5 mg as well as dutasteride.  IPSS 19, quality-of-life score 2.  Biggest issue is frequency and urgency.  Worse after he drinks caffeine.  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  Occasional urgency incontinence.  Past Medical History:  Diagnosis Date   Hypertension    Stroke (HCC)    tia back in 2008    Past Surgical History:  Procedure Laterality Date   CIRCUMCISION     LUMBAR LAMINECTOMY/DECOMPRESSION MICRODISCECTOMY Bilateral 04/08/2018   Procedure: LAMINECTOMY  AND FORAMINOTOMY BILATERAL LUMBAR TWO-THREE, LUMBAR THREE-FOUR;  Surgeon: Tressie Stalker, MD;  Location: [facility] OR;  Service: Neurosurgery;  Laterality: Bilateral;  LAMINECTOMY  AND FORAMINOTOMY BILATERAL LUMBAR TWO-THREE, LUMBAR THREE-FOUR    Home Medications:  Allergies as of 07/08/2022   No Known Allergies      Medication List        Accurate as of July 08, 2022  9:10 AM. If you have any questions, ask your nurse or doctor.          amLODipine 10 MG tablet Commonly known as: NORVASC Take 10 mg by mouth  daily.   amLODipine 5 MG tablet Commonly known as: NORVASC Take 5 mg by mouth daily.   aspirin EC 81 MG tablet Take 81 mg by mouth daily.   diclofenac Sodium 1 % Gel Commonly known as: VOLTAREN Apply topically.   dutasteride 0.5 MG capsule Commonly known as: AVODART Take 0.5 mg by mouth daily.   finasteride 5 MG tablet Commonly known as: PROSCAR Take 1 tablet (5 mg total) by mouth daily.   Fish Oil 1000 MG Caps Take 1,000 mg by mouth 2 (two) times daily.   levocetirizine 5 MG tablet Commonly known as: XYZAL Take 5 mg by mouth daily.   lisinopril-hydrochlorothiazide 20-25 MG tablet Commonly known as: ZESTORETIC Take 1 tablet by mouth daily.   tadalafil 20 MG tablet Commonly known as: CIALIS Take 1 tablet (20 mg total) by mouth as needed for erectile dysfunction.   tadalafil 5 MG tablet Commonly known as: CIALIS Take 1 tab daily        Allergies: No Known Allergies  No family history on file.  Social History:  reports that he has never smoked. He has never used smokeless tobacco. He reports current alcohol use of about 4.0 standard drinks of alcohol per week. He reports that he does not use drugs.  ROS: A complete review of systems was performed.  All systems are negative except for pertinent findings as noted.  Physical Exam:  Vital signs in last 24 hours: There were no vitals taken for this visit. Constitutional:  Alert and oriented, No acute distress Cardiovascular: Regular rate  Respiratory: Normal respiratory effort Psychiatric: Normal mood and affect  I have reviewed prior pt notes  I have reviewed urinalysis results  I have independently reviewed prior imaging-prostate ultrasound  I have reviewed prior PSA results  I reviewed pathology results  Bladder scan today revealed a residual volume of 45 mL   Impression/Assessment:  1.  Elevated PSA with negative fusion biopsy in 2021.  PSA declining appropriately, on 5 alpha reductase  inhibitor.  2.  BPH with 120 g gland seen on MRI.  Symptoms bothersome, especially urgency and frequency.  He empties well.  3.  ED, on daily Cialis  Plan:  1.  I gave him an overactive bladder guide sheet  2.  Continue on daily Cialis and dutasteride  3.  I will have him come back in about 6 months for PSA check

## 2022-07-09 LAB — MICROSCOPIC EXAMINATION
Bacteria, UA: NONE SEEN
RBC, Urine: NONE SEEN /HPF (ref 0–2)
WBC, UA: NONE SEEN /HPF (ref 0–5)

## 2022-07-09 LAB — URINALYSIS, ROUTINE W REFLEX MICROSCOPIC
Bilirubin, UA: NEGATIVE
Glucose, UA: NEGATIVE
Ketones, UA: NEGATIVE
Leukocytes,UA: NEGATIVE
Nitrite, UA: NEGATIVE
RBC, UA: NEGATIVE
Specific Gravity, UA: 1.015 (ref 1.005–1.030)
Urobilinogen, Ur: 0.2 mg/dL (ref 0.2–1.0)
pH, UA: 5.5 (ref 5.0–7.5)

## 2023-01-05 NOTE — Progress Notes (Signed)
History of Present Illness: Elevated PSA:     9.26.2014:TRUS/Bx prostatic volume 48 ml. PSA 6, PSAD 0.14.  10.6.2020: PSA 10.31.2019--8.6. PCA-3 (8.19.2020) 26.    11.30.2020: MRI prostate.  Volume 125 mL.  PI-RADS 4 lesion at left posterior/anterior peripheral zone 20 x 6 x 14 mm.  There was no evidence of neurovascular/seminal vesicle/capsular involvement, no lymphadenopathy or bony abnormalities.   1.22.2021: Fusion biopsy.  Prostate volume 158 mL.  4 cores from ROI were negative.  2 cores from systematic biopsy revealed small focus of atypia, 1 revealed high-grade PIN.   12.28.2021: PSA 5.7 (finasteride correction 11.4) 6.28.2022: PSA 4.3 (corrected 8.6) 12.27.2022: PSA 3.9 (corrected 7.8).  On dutasteride 6.27.2023: PSA 3.9 (corrected 7.8) 12.19.2023: PSA 3.1. (corrected 6.2)   6.18.2024:  Past Medical History:  Diagnosis Date   Hypertension    Stroke (HCC)    tia back in 2008    Past Surgical History:  Procedure Laterality Date   CIRCUMCISION     LUMBAR LAMINECTOMY/DECOMPRESSION MICRODISCECTOMY Bilateral 04/08/2018   Procedure: LAMINECTOMY  AND FORAMINOTOMY BILATERAL LUMBAR TWO-THREE, LUMBAR THREE-FOUR;  Surgeon: Tressie Stalker, MD;  Location: [facility] OR;  Service: Neurosurgery;  Laterality: Bilateral;  LAMINECTOMY  AND FORAMINOTOMY BILATERAL LUMBAR TWO-THREE, LUMBAR THREE-FOUR    Home Medications:  Allergies as of 01/06/2023   No Known Allergies      Medication List        Accurate as of January 05, 2023  9:23 PM. If you have any questions, ask your nurse or doctor.          amLODipine 10 MG tablet Commonly known as: NORVASC Take 10 mg by mouth daily.   amLODipine 5 MG tablet Commonly known as: NORVASC Take 5 mg by mouth daily.   aspirin EC 81 MG tablet Take 81 mg by mouth daily.   diclofenac Sodium 1 % Gel Commonly known as: VOLTAREN Apply topically.   dutasteride 0.5 MG capsule Commonly known as: AVODART Take 0.5 mg by mouth daily.   Fish Oil  1000 MG Caps Take 1,000 mg by mouth 2 (two) times daily.   levocetirizine 5 MG tablet Commonly known as: XYZAL Take 5 mg by mouth daily.   lisinopril-hydrochlorothiazide 20-25 MG tablet Commonly known as: ZESTORETIC Take 1 tablet by mouth daily.   tadalafil 5 MG tablet Commonly known as: CIALIS Take 1 tab daily        Allergies: No Known Allergies  No family history on file.  Social History:  reports that he has never smoked. He has never used smokeless tobacco. He reports current alcohol use of about 4.0 standard drinks of alcohol per week. He reports that he does not use drugs.  ROS: A complete review of systems was performed.  All systems are negative except for pertinent findings as noted.  Physical Exam:  Vital signs in last 24 hours: There were no vitals taken for this visit. Constitutional:  Alert and oriented, No acute distress Cardiovascular: Regular rate  Respiratory: Normal respiratory effort GI: Abdomen is soft, nontender, nondistended, no abdominal masses. No CVAT.  Genitourinary: Normal male phallus, testes are descended bilaterally and non-tender and without masses, scrotum is normal in appearance without lesions or masses, perineum is normal on inspection. Lymphatic: No lymphadenopathy Neurologic: Grossly intact, no focal deficits Psychiatric: Normal mood and affect  I have reviewed prior pt notes  I have reviewed notes from referring/previous physicians  I have reviewed urinalysis results  I have independently reviewed prior imaging  I have reviewed prior  PSA results  I have reviewed prior urine culture   Impression/Assessment:  ***  Plan:  ***   History of Present Illness: Pt presents for followup of elevated PSA, BPH as well as ED.     Past Medical History:  Diagnosis Date   Hypertension    Stroke (HCC)    tia back in 2008    Past Surgical History:  Procedure Laterality Date   CIRCUMCISION     LUMBAR  LAMINECTOMY/DECOMPRESSION MICRODISCECTOMY Bilateral 04/08/2018   Procedure: LAMINECTOMY  AND FORAMINOTOMY BILATERAL LUMBAR TWO-THREE, LUMBAR THREE-FOUR;  Surgeon: Tressie Stalker, MD;  Location: [facility] OR;  Service: Neurosurgery;  Laterality: Bilateral;  LAMINECTOMY  AND FORAMINOTOMY BILATERAL LUMBAR TWO-THREE, LUMBAR THREE-FOUR    Home Medications:  Allergies as of 01/06/2023   No Known Allergies      Medication List        Accurate as of January 05, 2023  9:23 PM. If you have any questions, ask your nurse or doctor.          amLODipine 10 MG tablet Commonly known as: NORVASC Take 10 mg by mouth daily.   amLODipine 5 MG tablet Commonly known as: NORVASC Take 5 mg by mouth daily.   aspirin EC 81 MG tablet Take 81 mg by mouth daily.   diclofenac Sodium 1 % Gel Commonly known as: VOLTAREN Apply topically.   dutasteride 0.5 MG capsule Commonly known as: AVODART Take 0.5 mg by mouth daily.   Fish Oil 1000 MG Caps Take 1,000 mg by mouth 2 (two) times daily.   levocetirizine 5 MG tablet Commonly known as: XYZAL Take 5 mg by mouth daily.   lisinopril-hydrochlorothiazide 20-25 MG tablet Commonly known as: ZESTORETIC Take 1 tablet by mouth daily.   tadalafil 5 MG tablet Commonly known as: CIALIS Take 1 tab daily        Allergies: No Known Allergies  No family history on file.  Social History:  reports that he has never smoked. He has never used smokeless tobacco. He reports current alcohol use of about 4.0 standard drinks of alcohol per week. He reports that he does not use drugs.  ROS: A complete review of systems was performed.  All systems are negative except for pertinent findings as noted.  Physical Exam:  Vital signs in last 24 hours: There were no vitals taken for this visit. Constitutional:  Alert and oriented, No acute distress Cardiovascular: Regular rate  Respiratory: Normal respiratory effort Psychiatric: Normal mood and affect  I have reviewed  prior pt notes  I have reviewed urinalysis results  I have independently reviewed prior imaging-prostate ultrasound  I have reviewed prior PSA results  I reviewed pathology results  Bladder scan today revealed a residual volume of 45 mL   Impression/Assessment:  1.  Elevated PSA with negative fusion biopsy in 2021.  PSA declining appropriately, on 5 alpha reductase inhibitor.  2.  BPH with 120 g gland seen on MRI.  Symptoms bothersome, especially urgency and frequency.  He empties well.  3.  ED, on daily Cialis  Plan:  1.  I gave him an overactive bladder guide sheet  2.  Continue on daily Cialis and dutasteride  3.  I will have him come back in about 6 months for PSA check

## 2023-01-06 ENCOUNTER — Encounter: Payer: Self-pay | Admitting: Urology

## 2023-01-06 ENCOUNTER — Ambulatory Visit (INDEPENDENT_AMBULATORY_CARE_PROVIDER_SITE_OTHER): Payer: Managed Care, Other (non HMO) | Admitting: Urology

## 2023-01-06 VITALS — BP 134/76 | HR 48

## 2023-01-06 DIAGNOSIS — R972 Elevated prostate specific antigen [PSA]: Secondary | ICD-10-CM | POA: Diagnosis not present

## 2023-01-06 DIAGNOSIS — N521 Erectile dysfunction due to diseases classified elsewhere: Secondary | ICD-10-CM | POA: Diagnosis not present

## 2023-01-06 DIAGNOSIS — N401 Enlarged prostate with lower urinary tract symptoms: Secondary | ICD-10-CM

## 2023-01-06 DIAGNOSIS — R350 Frequency of micturition: Secondary | ICD-10-CM | POA: Diagnosis not present

## 2023-01-06 LAB — URINALYSIS, ROUTINE W REFLEX MICROSCOPIC
Bilirubin, UA: NEGATIVE
Glucose, UA: NEGATIVE
Ketones, UA: NEGATIVE
Leukocytes,UA: NEGATIVE
Nitrite, UA: NEGATIVE
RBC, UA: NEGATIVE
Specific Gravity, UA: 1.02 (ref 1.005–1.030)
Urobilinogen, Ur: 0.2 mg/dL (ref 0.2–1.0)
pH, UA: 6 (ref 5.0–7.5)

## 2023-01-07 LAB — PSA: Prostate Specific Ag, Serum: 2.6 ng/mL (ref 0.0–4.0)

## 2023-01-19 ENCOUNTER — Telehealth: Payer: Self-pay

## 2023-01-19 NOTE — Telephone Encounter (Signed)
Patient is made aware  Per Dr. Retta Diones "PSA 2.6. Normal" patient voiced understanding

## 2023-07-07 ENCOUNTER — Other Ambulatory Visit: Payer: Managed Care, Other (non HMO)

## 2023-07-07 DIAGNOSIS — R972 Elevated prostate specific antigen [PSA]: Secondary | ICD-10-CM

## 2023-07-08 LAB — PSA: Prostate Specific Ag, Serum: 2.7 ng/mL (ref 0.0–4.0)

## 2023-07-10 ENCOUNTER — Telehealth: Payer: Self-pay

## 2023-07-10 NOTE — Telephone Encounter (Signed)
-----   Message from Bertram Millard Dahlstedt sent at 07/10/2023 12:42 PM EST ----- Please call pt--good news psa stable  @ 2.7 ----- Message ----- From: Nell Range Lab Results In Sent: 07/08/2023   5:37 AM EST To: Marcine Matar, MD

## 2023-07-10 NOTE — Telephone Encounter (Signed)
Patient is made aware and voiced understanding. 

## 2024-01-04 NOTE — Progress Notes (Signed)
 History of Present Illness: Pt presents for followup of elevated PSA, BPH as well as ED.   Elevated PSA:   TRUS/Bx performed 9.26.2014 by Dr Cleatrice volume 48 ml. PSA 6, PSAD 0.14.  10.6.2020: PSA 10.31.2019--8.6. PCA-3 (8.19.2020) 26.    11.30.2020: MRI prostate.  Volume 125 mL.  PI-RADS 4 lesion at left posterior/anterior peripheral zone 20 x 6 x 14 mm.  There was no evidence of neurovascular/seminal vesicle/capsular involvement, no lymphadenopathy or bony abnormalities.   1.22.2021: Fusion biopsy.  Prostate volume 158 mL.  4 cores from ROI were negative.  2 cores from systematic biopsy revealed small focus of atypia, 1 revealed high-grade PIN.   12.28.2021: PSA 5.7 (finasteride  correction 11.4) 6.28.2022: PSA 4.3 (corrected 8.6) 12.27.2022: PSA 3.9 (corrected 7.8).  On dutasteride 6.27.2023: PSA 3.9 (corrected 7.8) 12.19.2023: PSA 3.1.  (corrected value 6.2) 12.17.2024: PSA 2.7 (corrected value 5.4)  6.17.2025: Here for routine check.  Still on daily Cialis  as well as dutasteride.  No recent PSA.  No real lower urinary tract complaints other than some urgency.  IPSS 13/2.  He retired 2 months ago.  His mom is being treated for progressive pancreatic cancer.  Past Medical History:  Diagnosis Date   Hypertension    Stroke (HCC)    tia back in 2008    Past Surgical History:  Procedure Laterality Date   CIRCUMCISION     LUMBAR LAMINECTOMY/DECOMPRESSION MICRODISCECTOMY Bilateral 04/08/2018   Procedure: LAMINECTOMY  AND FORAMINOTOMY BILATERAL LUMBAR TWO-THREE, LUMBAR THREE-FOUR;  Surgeon: Mavis Purchase, MD;  Location: [facility] OR;  Service: Neurosurgery;  Laterality: Bilateral;  LAMINECTOMY  AND FORAMINOTOMY BILATERAL LUMBAR TWO-THREE, LUMBAR THREE-FOUR    Home Medications:  Allergies as of 01/05/2024   No Known Allergies      Medication List        Accurate as of January 04, 2024  6:16 AM. If you have any questions, ask your nurse or doctor.          amLODipine  10 MG  tablet Commonly known as: NORVASC  Take 10 mg by mouth daily.   amLODipine  5 MG tablet Commonly known as: NORVASC  Take 5 mg by mouth daily.   aspirin EC 81 MG tablet Take 81 mg by mouth daily.   diclofenac Sodium 1 % Gel Commonly known as: VOLTAREN Apply topically.   dutasteride 0.5 MG capsule Commonly known as: AVODART Take 0.5 mg by mouth daily.   Fish Oil 1000 MG Caps Take 1,000 mg by mouth 2 (two) times daily.   levocetirizine 5 MG tablet Commonly known as: XYZAL Take 5 mg by mouth daily.   lisinopril -hydrochlorothiazide  20-25 MG tablet Commonly known as: ZESTORETIC  Take 1 tablet by mouth daily.   losartan 25 MG tablet Commonly known as: COZAAR Take 25 mg by mouth daily.   tadalafil  5 MG tablet Commonly known as: CIALIS  Take 1 tab daily        Allergies: No Known Allergies  No family history on file.  Social History:  reports that he has never smoked. He has never used smokeless tobacco. He reports current alcohol use of about 4.0 standard drinks of alcohol per week. He reports that he does not use drugs.  ROS: A complete review of systems was performed.  All systems are negative except for pertinent findings as noted.  Physical Exam:  Vital signs in last 24 hours: There were no vitals taken for this visit. Constitutional:  Alert and oriented, No acute distress Cardiovascular: Regular rate  Respiratory: Normal respiratory effort GU:  Normal anal sphincter tone.  Apex of prostate normal, entire prostate not able to be palpated Psychiatric: Normal mood and affect  I have reviewed prior pt notes  I have reviewed urinalysis results-clear  IPSS reviewed  I have independently reviewed prior imaging-prostate ultrasound volume  I have reviewed prior PSA and pathology results     Impression/Assessment:  1.  Elevated PSA with negative fusion biopsy in 2021.  PSA declining appropriately, on 5 alpha reductase inhibitor.  Normal exam today  2.  BPH  with 120 g gland seen on MRI.  Symptoms bothersome, especially urgency and frequency.  Symptoms stable, tolerable  3.  ED, on daily Cialis   Plan:  1.  I will have PSA checked today, and then again in 6 months  2.  Routine check in 1 year

## 2024-01-05 ENCOUNTER — Encounter: Payer: Self-pay | Admitting: Urology

## 2024-01-05 ENCOUNTER — Ambulatory Visit: Payer: Managed Care, Other (non HMO) | Admitting: Urology

## 2024-01-05 VITALS — BP 143/64 | HR 57

## 2024-01-05 DIAGNOSIS — R972 Elevated prostate specific antigen [PSA]: Secondary | ICD-10-CM

## 2024-01-05 DIAGNOSIS — R350 Frequency of micturition: Secondary | ICD-10-CM

## 2024-01-05 DIAGNOSIS — N401 Enlarged prostate with lower urinary tract symptoms: Secondary | ICD-10-CM | POA: Diagnosis not present

## 2024-01-05 DIAGNOSIS — N521 Erectile dysfunction due to diseases classified elsewhere: Secondary | ICD-10-CM

## 2024-01-05 LAB — URINALYSIS, ROUTINE W REFLEX MICROSCOPIC
Bilirubin, UA: NEGATIVE
Glucose, UA: NEGATIVE
Ketones, UA: NEGATIVE
Leukocytes,UA: NEGATIVE
Nitrite, UA: NEGATIVE
Protein,UA: NEGATIVE
RBC, UA: NEGATIVE
Specific Gravity, UA: 1.01 (ref 1.005–1.030)
Urobilinogen, Ur: 0.2 mg/dL (ref 0.2–1.0)
pH, UA: 7 (ref 5.0–7.5)

## 2024-01-06 LAB — PSA: Prostate Specific Ag, Serum: 3 ng/mL (ref 0.0–4.0)

## 2024-01-08 ENCOUNTER — Ambulatory Visit: Payer: Self-pay | Admitting: Urology

## 2024-01-13 ENCOUNTER — Telehealth: Payer: Self-pay

## 2024-01-13 NOTE — Telephone Encounter (Signed)
 Called pt to give PSA results from MD Dahlstedt Let him know that psa is stable @ 3 pt voiced his understanding

## 2024-07-05 ENCOUNTER — Other Ambulatory Visit: Payer: PRIVATE HEALTH INSURANCE

## 2024-07-05 DIAGNOSIS — R972 Elevated prostate specific antigen [PSA]: Secondary | ICD-10-CM

## 2024-07-06 LAB — PSA: Prostate Specific Ag, Serum: 2.7 ng/mL (ref 0.0–4.0)

## 2024-07-20 ENCOUNTER — Telehealth: Payer: Self-pay

## 2024-07-20 NOTE — Telephone Encounter (Signed)
 The patient did not get his MyChart note from me.  Let him know his PSA was 2.7, good.  Pt made aware and voiced understanding

## 2025-01-03 ENCOUNTER — Ambulatory Visit: Admitting: Urology
